# Patient Record
Sex: Female | Born: 1990 | Race: White | Hispanic: No | Marital: Married | Smoking: Former smoker
Health system: Southern US, Community
[De-identification: ages and names within clinical notes are randomized; demographics above are authoritative.]

## PROBLEM LIST (undated history)

## (undated) DIAGNOSIS — O24419 Gestational diabetes mellitus in pregnancy, unspecified control: Secondary | ICD-10-CM

## (undated) DIAGNOSIS — T8859XA Other complications of anesthesia, initial encounter: Secondary | ICD-10-CM

## (undated) DIAGNOSIS — Z8632 Personal history of gestational diabetes: Secondary | ICD-10-CM

## (undated) DIAGNOSIS — E119 Type 2 diabetes mellitus without complications: Secondary | ICD-10-CM

## (undated) DIAGNOSIS — G971 Other reaction to spinal and lumbar puncture: Secondary | ICD-10-CM

## (undated) DIAGNOSIS — Z8751 Personal history of pre-term labor: Secondary | ICD-10-CM

## (undated) HISTORY — PX: THERAPEUTIC ABORTION: SHX798

## (undated) HISTORY — DX: Personal history of gestational diabetes: Z86.32

## (undated) HISTORY — DX: Personal history of pre-term labor: Z87.51

## (undated) HISTORY — PX: CHOLECYSTECTOMY: SHX55

---

## 2015-12-05 DIAGNOSIS — 419620001 Death: Secondary | SNOMED CT

## 2015-12-05 DEATH — deceased

## 2016-03-06 HISTORY — PX: DILATION AND CURETTAGE OF UTERUS: SHX78

## 2018-03-19 ENCOUNTER — Encounter (HOSPITAL_COMMUNITY): Payer: Self-pay | Admitting: Anesthesiology

## 2018-03-19 ENCOUNTER — Encounter (HOSPITAL_COMMUNITY): Payer: Self-pay | Admitting: Emergency Medicine

## 2018-03-19 ENCOUNTER — Emergency Department (HOSPITAL_COMMUNITY): Payer: Medicaid - Out of State

## 2018-03-19 ENCOUNTER — Inpatient Hospital Stay (HOSPITAL_COMMUNITY)
Admission: EM | Admit: 2018-03-19 | Discharge: 2018-03-21 | DRG: 776 | Disposition: A | Discharge status: Home / Self Care | Payer: Medicaid - Out of State | Attending: Obstetrics & Gynecology | Admitting: Obstetrics & Gynecology

## 2018-03-19 ENCOUNTER — Other Ambulatory Visit: Payer: Self-pay

## 2018-03-19 DIAGNOSIS — O894 Spinal and epidural anesthesia-induced headache during the puerperium: Secondary | ICD-10-CM | POA: Diagnosis present

## 2018-03-19 DIAGNOSIS — O1495 Unspecified pre-eclampsia, complicating the puerperium: Secondary | ICD-10-CM | POA: Diagnosis present

## 2018-03-19 DIAGNOSIS — G971 Other reaction to spinal and lumbar puncture: Secondary | ICD-10-CM | POA: Diagnosis present

## 2018-03-19 DIAGNOSIS — O165 Unspecified maternal hypertension, complicating the puerperium: Secondary | ICD-10-CM | POA: Diagnosis present

## 2018-03-19 DIAGNOSIS — Z87891 Personal history of nicotine dependence: Secondary | ICD-10-CM

## 2018-03-19 LAB — PROTIME-INR
INR: 1.02
Prothrombin Time: 13.3 seconds (ref 11.4–15.2)

## 2018-03-19 LAB — COMPREHENSIVE METABOLIC PANEL
ALT: 18 U/L (ref 0–44)
AST: 16 U/L (ref 15–41)
Albumin: 3.4 g/dL — ABNORMAL LOW (ref 3.5–5.0)
Alkaline Phosphatase: 119 U/L (ref 38–126)
Anion gap: 10 (ref 5–15)
BUN: 9 mg/dL (ref 6–20)
CO2: 24 mmol/L (ref 22–32)
Calcium: 9.2 mg/dL (ref 8.9–10.3)
Chloride: 106 mmol/L (ref 98–111)
Creatinine, Ser: 0.7 mg/dL (ref 0.44–1.00)
GFR calc Af Amer: >60 mL/min (ref >60)
GFR calc non Af Amer: >60 mL/min (ref >60)
Glucose, Bld: 94 mg/dL (ref 70–99)
Potassium: 4.2 mmol/L (ref 3.5–5.1)
Sodium: 140 mmol/L (ref 135–145)
Total Bilirubin: 0.8 mg/dL (ref 0.3–1.2)
Total Protein: 7.3 g/dL (ref 6.5–8.1)

## 2018-03-19 LAB — CBC WITH DIFFERENTIAL/PLATELET
Abs Immature Granulocytes: 0.04 10*3/uL (ref 0.00–0.07)
Basophils Absolute: 0 10*3/uL (ref 0.0–0.1)
Basophils Relative: 1 %
Eosinophils Absolute: 0.1 10*3/uL (ref 0.0–0.5)
Eosinophils Relative: 2 %
HCT: 37.7 % (ref 36.0–46.0)
Hemoglobin: 12.1 g/dL (ref 12.0–15.0)
Immature Granulocytes: 1 %
Lymphocytes Relative: 23 %
Lymphs Abs: 1.5 10*3/uL (ref 0.7–4.0)
MCH: 31.9 pg (ref 26.0–34.0)
MCHC: 32.1 g/dL (ref 30.0–36.0)
MCV: 99.5 fL (ref 80.0–100.0)
Monocytes Absolute: 0.3 10*3/uL (ref 0.1–1.0)
Monocytes Relative: 5 %
Neutro Abs: 4.5 10*3/uL (ref 1.7–7.7)
Neutrophils Relative %: 68 %
Platelets: 330 10*3/uL (ref 150–400)
RBC: 3.79 MIL/uL — ABNORMAL LOW (ref 3.87–5.11)
RDW: 12.4 % (ref 11.5–15.5)
WBC: 6.5 10*3/uL (ref 4.0–10.5)
nRBC: 0 % (ref 0.0–0.2)

## 2018-03-19 LAB — URINALYSIS, ROUTINE W REFLEX MICROSCOPIC
Bilirubin Urine: NEGATIVE
Glucose, UA: NEGATIVE mg/dL
Ketones, ur: NEGATIVE mg/dL
Leukocytes, UA: NEGATIVE
Nitrite: NEGATIVE
Protein, ur: NEGATIVE mg/dL
Specific Gravity, Urine: 1.011 (ref 1.005–1.030)
pH: 7 (ref 5.0–8.0)

## 2018-03-19 MED ORDER — MAGNESIUM SULFATE BOLUS VIA INFUSION
4.0000 g | Freq: Once | INTRAVENOUS | Status: AC
  Administered 2018-03-19: 4 g via INTRAVENOUS
  Filled 2018-03-19: qty 500

## 2018-03-19 MED ORDER — LABETALOL HCL 5 MG/ML IV SOLN: 20.0000 mg | INTRAVENOUS | Status: DC | PRN

## 2018-03-19 MED ORDER — LABETALOL HCL 5 MG/ML IV SOLN: 80.0000 mg | INTRAVENOUS | Status: DC | PRN

## 2018-03-19 MED ORDER — HYDRALAZINE HCL 20 MG/ML IJ SOLN: 10.0000 mg | INTRAMUSCULAR | Status: DC | PRN

## 2018-03-19 MED ORDER — ONDANSETRON HCL 4 MG/2ML IJ SOLN
4.0000 mg | Freq: Once | INTRAMUSCULAR | Status: AC
  Administered 2018-03-19: 4 mg via INTRAVENOUS
  Filled 2018-03-19: qty 2

## 2018-03-19 MED ORDER — OXYCODONE-ACETAMINOPHEN 5-325 MG PO TABS
1.0000 | ORAL_TABLET | Freq: Four times a day (QID) | ORAL | Status: DC | PRN
  Administered 2018-03-19 – 2018-03-20 (×4): 2 via ORAL
  Filled 2018-03-19 (×5): qty 2

## 2018-03-19 MED ORDER — HYDRALAZINE HCL 20 MG/ML IJ SOLN
10.0000 mg | Freq: Once | INTRAMUSCULAR | Status: AC
  Administered 2018-03-19: 10 mg via INTRAVENOUS
  Filled 2018-03-19: qty 1

## 2018-03-19 MED ORDER — MAGNESIUM SULFATE 40 G IN LACTATED RINGERS - SIMPLE
2.0000 g/h | INTRAVENOUS | Status: AC
  Administered 2018-03-19: 2 g/h via INTRAVENOUS
  Filled 2018-03-19 (×3): qty 500

## 2018-03-19 MED ORDER — LACTATED RINGERS IV SOLN
INTRAVENOUS | Status: DC
  Administered 2018-03-19: 19:00:00 via INTRAVENOUS

## 2018-03-19 MED ORDER — DIPHENHYDRAMINE HCL 50 MG/ML IJ SOLN
25.0000 mg | Freq: Once | INTRAMUSCULAR | Status: AC
  Administered 2018-03-19: 25 mg via INTRAVENOUS
  Filled 2018-03-19: qty 1

## 2018-03-19 MED ORDER — MORPHINE SULFATE (PF) 4 MG/ML IV SOLN
4.0000 mg | Freq: Once | INTRAVENOUS | Status: AC
  Administered 2018-03-19: 4 mg via INTRAVENOUS
  Filled 2018-03-19: qty 1

## 2018-03-19 MED ORDER — LABETALOL HCL 5 MG/ML IV SOLN: 40.0000 mg | INTRAVENOUS | Status: DC | PRN

## 2018-03-19 MED ORDER — SODIUM CHLORIDE 0.9 % IV BOLUS
1000.0000 mL | Freq: Once | INTRAVENOUS | Status: AC
  Administered 2018-03-19: 1000 mL via INTRAVENOUS

## 2018-03-19 MED ORDER — METOCLOPRAMIDE HCL 5 MG/ML IJ SOLN
10.0000 mg | Freq: Once | INTRAMUSCULAR | Status: AC
  Administered 2018-03-19: 10 mg via INTRAVENOUS
  Filled 2018-03-19: qty 2

## 2018-03-19 NOTE — ED Notes (Signed)
Pt stats abdominal pain has resolved.Report to care link

## 2018-03-19 NOTE — ED Triage Notes (Signed)
Pt brought down by rapid response (visiting her child inpatient) for facial swelling and neck pain. She is 8 days postpartum She was told that she is having muscle spasms from the epidural and complications from it. Denies fever, nausea or vomiting.

## 2018-03-19 NOTE — ED Notes (Addendum)
RE[ort to charge nurse at [facility] room 319.

## 2018-03-19 NOTE — ED Notes (Addendum)
Pt c/o pain at right upper quadrant. Dr. Judd Lien.aware and will see pt.

## 2018-03-19 NOTE — Progress Notes (Signed)
ANMD notified of Dr. Denyce Robert request to see patient.

## 2018-03-19 NOTE — ED Provider Notes (Signed)
[facility] EMERGENCY DEPARTMENT Provider Note   CSN: [id number] Arrival date & time: 03/19/18  1158     History   Chief Complaint Chief Complaint  Patient presents with  . Neck Pain  . Facial Swelling    HPI Yvette Mosley is a 28 y.o. female is 1 week postpartum who presents for evaluation of headache, facial swelling, neck pain.  Patient reports that she has been having an intermittent headache over the last week after she got an epidural for her vaginal delivery.  Patient reports she was seen again by the anesthesiologist and was cleared.  She was given naproxen, Robaxin she states she has been taking but has not provided any relief.  Patient reports that her headache worsened this morning.  She describes it as a pressure in her head.  Patient states that she was upstairs in the NICU visiting her newborn child when she felt like the headache was worsening as well as having some neck pain.  She was noted to have some mild facial swelling and was sent to the ED for further evaluation.  Patient states that the headache is worse if she lays down flat and worse if she sits up and moves around.  Additionally, her neck pain is worse with movement.  She states she has not noted any swelling in her neck or face.  She reports she is still able to tolerate her secretions and p.o. without any difficulty.  She reports no complications during her pregnancy.  Patient reports that she has not had any preceding trauma, tear, fall.  Patient denies any chest pain, vision changes, difficulty breathing, abdominal pain, numbness/weakness of her arms or legs, swelling in her legs.  The history is provided by the patient.    History reviewed. No pertinent past medical history.  Patient Active Problem List   Diagnosis Date Noted  . Preeclampsia in postpartum period 03/19/2018    History reviewed. No pertinent surgical history.   OB History   No obstetric history on file.       Home Medications    Prior to Admission medications   Medication Sig Start Date End Date Taking? Authorizing Provider  methocarbamol (ROBAXIN) 750 MG tablet Take 1,500 mg by mouth 4 (four) times daily.    Yes [provider]  naproxen (NAPRELAN) 500 MG 24 hr tablet Take 500 mg by mouth 2 (two) times daily as needed (pain, swelling).   Yes [provider]    Family History No family history on file.  Social History Social History   Tobacco Use  . Smoking status: Former Smoker    Types: Cigarettes  . Smokeless tobacco: Never Used  Substance Use Topics  . Alcohol use: Not Currently  . Drug use: Not Currently     Allergies   Alpha-gal and Ceclor [cefaclor]   Review of Systems Review of Systems  Constitutional: Negative for fever.  Eyes: Negative for visual disturbance.  Respiratory: Negative for cough and shortness of breath.   Cardiovascular: Negative for chest pain.  Gastrointestinal: Negative for abdominal pain, nausea and vomiting.  Genitourinary: Negative for dysuria and hematuria.  Musculoskeletal: Positive for neck pain. Negative for back pain.  Neurological: Positive for headaches. Negative for weakness and numbness.  All other systems reviewed and are negative.    Physical Exam Updated Vital Signs BP (!) 163/87   Pulse 78   Temp 97.9 F (36.6 C) (Oral)   Resp 16   Ht 5\' 3"  (1.6 m)  Wt 68 kg   SpO2 98%   BMI 26.57 kg/m   Physical Exam Vitals signs and nursing note reviewed.  Constitutional:      Appearance: Normal appearance. She is well-developed.  HENT:     Head: Normocephalic and atraumatic.     Mouth/Throat:     Mouth: Mucous membranes are moist.     Pharynx: Oropharynx is clear.     Comments: Uvula is midline.  No trismus. Airway is patent, phonation is intact. Eyes:     General: Lids are normal.     Extraocular Movements: Extraocular movements intact.     Conjunctiva/sclera: Conjunctivae normal.     Pupils:  Pupils are equal, round, and reactive to light.     Comments: PERRL, EOMs intact  Neck:     Musculoskeletal: Neck supple. No neck rigidity.     Comments: Neck is symmetric in appearance without any edema, erythema.  No crepitus noted. Supple without rigidity. Limited range of motion secondary to pain. Cardiovascular:     Rate and Rhythm: Normal rate and regular rhythm.     Pulses: Normal pulses.          Radial pulses are 2+ on the right side and 2+ on the left side.       Dorsalis pedis pulses are 2+ on the right side and 2+ on the left side.     Heart sounds: Normal heart sounds. No murmur. No friction rub. No gallop.   Pulmonary:     Effort: Pulmonary effort is normal.     Breath sounds: Normal breath sounds.     Comments: Lungs clear to auscultation bilaterally.  Symmetric chest rise.  No wheezing, rales, rhonchi. Abdominal:     Palpations: Abdomen is soft. Abdomen is not rigid.     Tenderness: There is no abdominal tenderness. There is no guarding.     Comments: Abdomen is soft, non-distended, non-tender. No rigidity, No guarding. No peritoneal signs.  Musculoskeletal: Normal range of motion.     Thoracic back: She exhibits no tenderness.     Lumbar back: She exhibits no tenderness.  Skin:    General: Skin is warm and dry.     Capillary Refill: Capillary refill takes less than 2 seconds.  Neurological:     Mental Status: She is alert and oriented to person, place, and time.     Comments: Cranial nerves III-XII intact Follows commands, Moves all extremities  5/5 strength to BUE and BLE  Sensation intact throughout all major nerve distributions Normal finger to nose. No dysdiadochokinesia. No pronator drift. No gait abnormalities  No slurred speech. No facial droop.   Psychiatric:        Speech: Speech normal.      ED Treatments / Results  Labs (all labs ordered are listed, but only abnormal results are displayed) Labs Reviewed  CBC WITH DIFFERENTIAL/PLATELET -  Abnormal; Notable for the following components:      Result Value   RBC 3.79 (*)    All other components within normal limits  COMPREHENSIVE METABOLIC PANEL - Abnormal; Notable for the following components:   Albumin 3.4 (*)    All other components within normal limits  URINALYSIS, ROUTINE W REFLEX MICROSCOPIC - Abnormal; Notable for the following components:   Color, Urine STRAW (*)    Hgb urine dipstick MODERATE (*)    Bacteria, UA RARE (*)    All other components within normal limits  PROTIME-INR    EKG None  Radiology Ct Head  Wo Contrast  Result Date: 03/19/2018 CLINICAL DATA:  Headache with facial swelling and neck pain. Recent postpartum. EXAM: CT HEAD WITHOUT CONTRAST TECHNIQUE: Contiguous axial images were obtained from the base of the skull through the vertex without intravenous contrast. COMPARISON:  None. FINDINGS: Brain: Ventricles are normal in size and configuration. There is no demonstrable intracranial mass, hemorrhage, extra-axial fluid collection, or midline shift. Brain parenchyma appears unremarkable. No acute infarct. An area of decreased attenuation on the axial images along the superior right cerebellum is shown on coronal and sagittal images to represent mild asymmetric fluid adjacent to the tentorium. Vascular: No appreciable hyperdense vessel. No appreciable vascular calcification. Skull: The bony calvarium appears intact. Sinuses/Orbits: Visualized paranasal sinuses are clear. Orbits appear symmetric bilaterally. Other: Mastoid air cells are clear. IMPRESSION: Study within normal limits. Electronically Signed   By: Bretta Bang III M.D.   On: 03/19/2018 13:58    Procedures .Critical Care Performed by: Maxwell Caul, PA-C Authorized by: Maxwell Caul, PA-C   Critical care provider statement:    Critical care time (minutes):  30   Critical care was necessary to treat or prevent imminent or life-threatening deterioration of the following conditions:   Metabolic crisis and CNS failure or compromise   Critical care was time spent personally by me on the following activities:  Discussions with consultants, evaluation of patient's response to treatment, examination of patient, ordering and performing treatments and interventions, ordering and review of laboratory studies, ordering and review of radiographic studies, pulse oximetry, re-evaluation of patient's condition, obtaining history from patient or surrogate and review of old charts   (including critical care time)  Medications Ordered in ED Medications  magnesium bolus via infusion 4 g (has no administration in time range)  magnesium sulfate 40 grams in LR 500 mL OB infusion (has no administration in time range)  sodium chloride 0.9 % bolus 1,000 mL (0 mLs Intravenous Stopped 03/19/18 1605)  metoCLOPramide (REGLAN) injection 10 mg (10 mg Intravenous Given 03/19/18 1401)  diphenhydrAMINE (BENADRYL) injection 25 mg (25 mg Intravenous Given 03/19/18 1409)  ondansetron (ZOFRAN) injection 4 mg (4 mg Intravenous Given 03/19/18 1400)  hydrALAZINE (APRESOLINE) injection 10 mg (10 mg Intravenous Given 03/19/18 1519)     Initial Impression / Assessment and Plan / ED Course  I have reviewed the triage vital signs and the nursing notes.  Pertinent labs & imaging results that were available during my care of the patient were reviewed by me and considered in my medical decision making (see chart for details).     28 year old female who is 1 week postpartum who presents for evaluation of headache, neck pain that is been ongoing for last week.  She reports symptoms worsened today.  She was upstairs visiting her child when symptoms worsen and she was sent down to the ED for further evaluation.  Denies any chest pain, difficulty breathing, vision changes.  Reports normal spontaneous vaginal delivery but does report that she delivered at 37 weeks.  She states that she did not have any complications with the  pregnancy.  On initial ED arrival, patient is slightly bradycardic and hypertensive with blood pressure 169/97.  On my evaluation, her blood pressure was high as 172/111.  Headache is worsened with positional changes.  She states that it is better if she lays down supine and worse if she gets up and turns and moves around.  Consider spinal headache versus preeclampsia. Exam not concerning for peritonsillar abscess or Ludwig's angina.  We will  plan to check basic labs.  Additionally, given hypertension, will give dose of hydralazine.  Patient reports that she saw [place] in [place].  She lived in [place] and then her child was [facility].  Will consult on-call OB/GYN for evaluation of postpartum preeclampsia.  PT/INR is within normal limits.  CMP shows normal LFT's.  CBC without any significant leukocytosis or anemia.  Patiently persistently hypertensive with blood pressures ranging in the 160s over 90s.  Patient had one blood pressure of 139/73 prior to getting hydralazine.  Her repeat blood pressure was again elevated at 163/87.  Hydralazine given.  Discussed patient with Dr. Adrian BlackwaterStinson (OB/GYN).  Given blood pressures as well as patient symptoms, recommends admission to women's for postpartum preeclampsia.  Plan to start magnesium.  Updated patient on plan.  She is agreeable.  Portions of this note were generated with Scientist, clinical (histocompatibility and immunogenetics)Dragon dictation software. Dictation errors may occur despite best attempts at proofreading.   Final Clinical Impressions(s) / ED Diagnoses   Final diagnoses:  Preeclampsia in postpartum period    ED Discharge Orders    None       Maxwell CaulLayden, Jessamyn Watterson A, PA-C 03/20/18 1401    Gwyneth SproutPlunkett, Whitney, MD 03/23/18 1335

## 2018-03-19 NOTE — ED Notes (Signed)
Pt oob to bathroom with steady gait. 

## 2018-03-19 NOTE — H&P (Signed)
[facility] History and Physical  [name] Nicole Arnall FAO:[id number] DOB: 1991/02/22 DOA: 03/19/2018  Referring physician: Ferdinand CavaLayden, PA-C, ED provider PCP: Patient, No Pcp Per  Outpatient Specialists:   Patient Coming From: home  Chief Complaint: headache  HPI: Yvette Mosley is a 28 y.o. female who is 8 days postpartum from a vaginal delivery at 37 weeks due to oligohydramnios. Delivery was uncomplicated. She did have an epidural  For pain control during the labor, which never really worked right per the patient. Following delivery, she had a headache that was worse with sitting up and moving her neck, relieved with laying down. Anesthesia assessed her, told her it wasn't a spinal headache and instructed her to drink lots of caffeine and lay down a lot. Following her release from the hospital, she visited the ED a couple of times with similar complaints, but has just been prescribed muscle relaxerants and NSAIDs, neither of which has helped.   Her baby's course has been complicated by failure to thrive and is currently hospitalized [facility] pediatric [facility]. She presented to the ED for worsening headache and facial swelling.   Emergency Department Course: BP initially 172/111 and has continued to have several BPs in severe range. I was consulted, instructed the start of magnesium, which has not changed the patient's headache.  Review of Systems:   Pt denies any fevers, chills, nausea, vomiting, diarrhea, constipation, abdominal pain, shortness of breath, dyspnea on exertion, orthopnea, cough, wheezing, palpitations, headache, vision changes, lightheadedness, dizziness, melena, rectal bleeding.  Review of systems are otherwise negative  History reviewed. No pertinent past medical history. History reviewed. No pertinent surgical history. Social History:  reports that she has quit smoking. Her smoking use included cigarettes. She has never used smokeless  tobacco. She reports previous alcohol use. She reports previous drug use. Patient lives at home  Allergies  Allergen Reactions  . Alpha-Gal Other (See Comments)    Abdominal pain   . Ceclor [Cefaclor] Hives    No family history on file.    Prior to Admission medications   Medication Sig Start Date End Date Taking? Authorizing Provider  methocarbamol (ROBAXIN) 750 MG tablet Take 1,500 mg by mouth 4 (four) times daily.    Yes [provider]  naproxen (NAPRELAN) 500 MG 24 hr tablet Take 500 mg by mouth 2 (two) times daily as needed (pain, swelling).   Yes [provider]    Physical Exam: BP (!) 136/101 (BP Location: Left Arm) Comment: nurse notified  Pulse 81   Temp 98 F (36.7 C) (Oral)   Resp 20   Ht 5\' 3"  (1.6 m)   Wt 68 kg   SpO2 99%   BMI 26.57 kg/m   . General: Young caucasian female. Awake and alert and oriented x3. No acute cardiopulmonary distress.  Marland Kitchen. HEENT: Normocephalic atraumatic.  Right and left ears normal in appearance.  Pupils equal, round, reactive to light. Extraocular muscles are intact. Sclerae anicteric and noninjected.  Moist mucosal membranes. No mucosal lesions.  . Neck: Neck supple without lymphadenopathy. No carotid bruits. No masses palpated.  . Cardiovascular: Regular rate with normal S1-S2 sounds. No murmurs, rubs, gallops auscultated. No JVD.  Marland Kitchen. Respiratory: Good respiratory effort with no wheezes, rales, rhonchi. Lungs clear to auscultation bilaterally.  No accessory muscle use. . Abdomen: Soft, nontender, nondistended. Active bowel sounds. No masses or hepatosplenomegaly  . Skin: No rashes, lesions, or ulcerations.  Dry, warm to touch. 2+ dorsalis pedis and radial  pulses. . Musculoskeletal: No calf or leg pain. All major joints not erythematous nontender.  No upper or lower joint deformation.  Good ROM.  No contractures  . Psychiatric: Intact judgment and insight. Pleasant and cooperative. . Neurologic: Unwilling to flex or  extend neck. No focal neurological deficits. Strength is 5/5 and symmetric in upper and lower extremities.  Cranial nerves II through XII are grossly intact.           Labs on Admission: I have personally reviewed following labs and imaging studies  CBC: Recent Labs  Lab 03/19/18 1407  WBC 6.5  NEUTROABS 4.5  HGB 12.1  HCT 37.7  MCV 99.5  PLT 330   Basic Metabolic Panel: Recent Labs  Lab 03/19/18 1407  NA 140  K 4.2  CL 106  CO2 24  GLUCOSE 94  BUN 9  CREATININE 0.70  CALCIUM 9.2   GFR: Estimated Creatinine Clearance: 98.6 mL/min (by C-G formula based on SCr of 0.7 mg/dL). Liver Function Tests: Recent Labs  Lab 03/19/18 1407  AST 16  ALT 18  ALKPHOS 119  BILITOT 0.8  PROT 7.3  ALBUMIN 3.4*   No results for input(s): LIPASE, AMYLASE in the last 168 hours. No results for input(s): AMMONIA in the last 168 hours. Coagulation Profile: Recent Labs  Lab 03/19/18 1407  INR 1.02   Cardiac Enzymes: No results for input(s): CKTOTAL, CKMB, CKMBINDEX, TROPONINI in the last 168 hours. BNP (last 3 results) No results for input(s): PROBNP in the last 8760 hours. HbA1C: No results for input(s): HGBA1C in the last 72 hours. CBG: No results for input(s): GLUCAP in the last 168 hours. Lipid Profile: No results for input(s): CHOL, HDL, LDLCALC, TRIG, CHOLHDL, LDLDIRECT in the last 72 hours. Thyroid Function Tests: No results for input(s): TSH, T4TOTAL, FREET4, T3FREE, THYROIDAB in the last 72 hours. Anemia Panel: No results for input(s): VITAMINB12, FOLATE, FERRITIN, TIBC, IRON, RETICCTPCT in the last 72 hours. Urine analysis:    Component Value Date/Time   COLORURINE STRAW (A) 03/19/2018 1500   APPEARANCEUR CLEAR 03/19/2018 1500   LABSPEC 1.011 03/19/2018 1500   PHURINE 7.0 03/19/2018 1500   GLUCOSEU NEGATIVE 03/19/2018 1500   HGBUR MODERATE (A) 03/19/2018 1500   BILIRUBINUR NEGATIVE 03/19/2018 1500   KETONESUR NEGATIVE 03/19/2018 1500   PROTEINUR NEGATIVE  03/19/2018 1500   NITRITE NEGATIVE 03/19/2018 1500   LEUKOCYTESUR NEGATIVE 03/19/2018 1500   Sepsis Labs: @LABRCNTIP (procalcitonin:4,lacticidven:4) )No results found for this or any previous visit (from the past 240 hour(s)).   Radiological Exams on Admission: Ct Head Wo Contrast  Result Date: 03/19/2018 CLINICAL DATA:  Headache with facial swelling and neck pain. Recent postpartum. EXAM: CT HEAD WITHOUT CONTRAST TECHNIQUE: Contiguous axial images were obtained from the base of the skull through the vertex without intravenous contrast. COMPARISON:  None. FINDINGS: Brain: Ventricles are normal in size and configuration. There is no demonstrable intracranial mass, hemorrhage, extra-axial fluid collection, or midline shift. Brain parenchyma appears unremarkable. No acute infarct. An area of decreased attenuation on the axial images along the superior right cerebellum is shown on coronal and sagittal images to represent mild asymmetric fluid adjacent to the tentorium. Vascular: No appreciable hyperdense vessel. No appreciable vascular calcification. Skull: The bony calvarium appears intact. Sinuses/Orbits: Visualized paranasal sinuses are clear. Orbits appear symmetric bilaterally. Other: Mastoid air cells are clear. IMPRESSION: Study within normal limits. Electronically Signed   By: Bretta Bang III M.D.   On: 03/19/2018 13:58    Assessment/Plan: Principal Problem:  Preeclampsia in postpartum period Active Problems:   Headache    This patient was discussed with the ED physician, including pertinent vitals, physical exam findings, labs, and imaging.  We also discussed care given by the ED provider.  1. Preeclampsia in postpartum period a. Start magnesium b. BP control c. Repeat labs tomorrow morning 2. Headache a. ? Spinal headache vs preeclampsia - headache appears atypical of preeclampsia and more similar to a spinal headache. b. Consulted with anesthesia, who will see the  patient.  DVT prophylaxis: SCDs Consultants: anesthesia Code Status: full Family Communication: none    Levie HeritageStinson, Carole Deere J, DO

## 2018-03-20 ENCOUNTER — Inpatient Hospital Stay (HOSPITAL_COMMUNITY): Payer: Medicaid - Out of State | Admitting: Anesthesiology

## 2018-03-20 ENCOUNTER — Encounter (HOSPITAL_COMMUNITY): Payer: Self-pay | Admitting: *Deleted

## 2018-03-20 DIAGNOSIS — O1495 Unspecified pre-eclampsia, complicating the puerperium: Principal | ICD-10-CM

## 2018-03-20 DIAGNOSIS — O165 Unspecified maternal hypertension, complicating the puerperium: Secondary | ICD-10-CM | POA: Diagnosis present

## 2018-03-20 LAB — COMPREHENSIVE METABOLIC PANEL
ALT: 15 U/L (ref 0–44)
AST: 16 U/L (ref 15–41)
Albumin: 3.2 g/dL — ABNORMAL LOW (ref 3.5–5.0)
Alkaline Phosphatase: 106 U/L (ref 38–126)
Anion gap: 7 (ref 5–15)
BUN: 8 mg/dL (ref 6–20)
CO2: 25 mmol/L (ref 22–32)
Calcium: 6.9 mg/dL — ABNORMAL LOW (ref 8.9–10.3)
Chloride: 105 mmol/L (ref 98–111)
Creatinine, Ser: 0.67 mg/dL (ref 0.44–1.00)
GFR calc Af Amer: >60 mL/min (ref >60)
GFR calc non Af Amer: >60 mL/min (ref >60)
Glucose, Bld: 97 mg/dL (ref 70–99)
Potassium: 3.8 mmol/L (ref 3.5–5.1)
Sodium: 137 mmol/L (ref 135–145)
Total Bilirubin: 0.5 mg/dL (ref 0.3–1.2)
Total Protein: 6.6 g/dL (ref 6.5–8.1)

## 2018-03-20 LAB — CBC
HCT: 34.2 % — ABNORMAL LOW (ref 36.0–46.0)
Hemoglobin: 11.2 g/dL — ABNORMAL LOW (ref 12.0–15.0)
MCH: 32.4 pg (ref 26.0–34.0)
MCHC: 32.7 g/dL (ref 30.0–36.0)
MCV: 98.8 fL (ref 80.0–100.0)
Platelets: 282 10*3/uL (ref 150–400)
RBC: 3.46 MIL/uL — ABNORMAL LOW (ref 3.87–5.11)
RDW: 12.7 % (ref 11.5–15.5)
WBC: 6 10*3/uL (ref 4.0–10.5)
nRBC: 0 % (ref 0.0–0.2)

## 2018-03-20 MED ORDER — FENTANYL CITRATE (PF) 100 MCG/2ML IJ SOLN
100.0000 ug | Freq: Once | INTRAMUSCULAR | Status: AC | PRN
  Administered 2018-03-20: 100 ug via INTRAVENOUS
  Filled 2018-03-20: qty 2

## 2018-03-20 MED ORDER — MIDAZOLAM HCL 2 MG/2ML IJ SOLN
2.0000 mg | Freq: Once | INTRAMUSCULAR | Status: AC | PRN
  Administered 2018-03-20: 2 mg via INTRAVENOUS
  Filled 2018-03-20: qty 2

## 2018-03-20 NOTE — Progress Notes (Signed)
Called received from [facility] and RN regarding patient complaining of headache.  Patient seen and evaluated in room 319. Patient reports she had an uneventful epidural placed on 03/11/18 in [place], [place] for vaginal delivery. Patient denies being told of an inadvertent dural puncture at that time.   Skin to lumbar area clean dry and intact. No ecchymosis or tenderness along lumbar spine.   Unable to review anesthesia records from outside hospital. Unsuccessful attempt made to contact anesthesia providers in [place], [place] regarding patient encounter.   Patient describes occipital headache and nuchal pain. Her symptoms could be caused by a post dural puncture headache, if dural puncture was made during epidural placement.     Recommend conservative treatment for post dural puncture headache, possible neurology consult, and possible additional imaging. Potential need for epidural blood patch discussed with patient.

## 2018-03-20 NOTE — Anesthesia Postprocedure Evaluation (Signed)
Anesthesia Post Note  Patient: Web designer  Procedure(s) Performed: AN AD HOC BLOOD PATCH     Patient location during evaluation: Women[facility] Unit Anesthesia Type: Epidural Level of consciousness: awake and alert and oriented Pain management: pain level controlled Vital Signs Assessment: post-procedure vital signs reviewed and stable Respiratory status: spontaneous breathing, nonlabored ventilation and respiratory function stable Cardiovascular status: blood pressure returned to baseline and stable Postop Assessment: no headache and no apparent nausea or vomiting Anesthetic complications: no Comments: Relief of her HA    Last Vitals:  Vitals:   03/20/18 1546 03/20/18 1600  BP: (!) 151/89 (!) 161/91  Pulse: (!) 54 (!) 55  Resp:    Temp:    SpO2:      Last Pain:  Vitals:   03/20/18 1620  TempSrc:   PainSc: 5    Pain Goal:                @ANFLOW60MIN (12500)  )Shagun Wordell A.

## 2018-03-20 NOTE — Lactation Note (Signed)
Lactation Consultation Note  Patient Name: Yvette Mosley COBTV'M Date: 03/20/2018 Reason for consult: Follow-up assessment(re-admit)  Visited with mom of a 88 days old baby, she's a post-partum readmit, baby is currently a patient in [facility] due to FTT, mom is a P4. Mom has been having really bad headaches (probably spinal headaches) and has only pumped once so far, she's not really feeling well. Light were completely off when entering the room, mom wished to keep them that way, asked if she felt any pain or discomfort on her breast in order to out-rule engorgement, but mom voiced that this is her 4th baby and the engorgement episode has already passed, her breast are not that full anymore; she declined a breast assessment. Reviewed engorgement prevention and treatment and instructed her to ask for ice as needed if she starts feeling full again and she's not able to empty the breast.  Baby is currently on breastmilk and on Similac formula, mom was transferred to a different room and her pump kit was taken and discarded. RN Delsa Sale to set up a new pumping kit for mom tonight when she's ready to start pumping again. Mom doesn't have any further questions or concerns regarding BF at this point; she's aware of [facility] services and will call PRN.  Maternal Data    Feeding   Interventions Interventions: Breast feeding basics reviewed  Lactation Tools Discussed/Used Pump Review: Setup, frequency, and cleaning;Milk Storage Initiated by:: RN Date initiated:: 03/19/18   Consult Status Consult Status: PRN    [name] S Romeka Scifres 03/20/2018, 6:14 PM

## 2018-03-20 NOTE — Progress Notes (Signed)
Dr Malen Gauze in with patient discussing plan of care with pt. Questions encouraged and answered by pt and husband

## 2018-03-20 NOTE — Anesthesia Preprocedure Evaluation (Addendum)
Anesthesia Evaluation  Patient identified by MRN, date of birth, ID band Patient awake    Reviewed: Allergy & Precautions, Patient's Chart, lab work & pertinent test results  Airway Mallampati: II  TM Distance: >3 FB Neck ROM: Full    Dental no notable dental hx. (+) Teeth Intact   Pulmonary former smoker,    Pulmonary exam normal breath sounds clear to auscultation       Cardiovascular hypertension, Normal cardiovascular exam Rhythm:Regular Rate:Normal  Hx/o Pre eclampsia was on MgSO4 up until 0500 today   Neuro/Psych  Headaches, Patient had pre eclampsia and delivered in [place], [place] on 03/11/2018. She had an epidural for quick labor according to patient and the anesthesia provider had to stick her 3 times to perform her epidural. She developed a severe HA 3 hours after placement. She was also hypertensive and was started on MgSO4 with a presumptive diagnosis of Pre eclampsia. She continued to have a persistent HA with accompanying nuchal stiffness and discomfort. She was seen, evaluated and treated in the ER at [place], [place] a few days after discharge for HA and was discharged after some improvement from being given Dilaudid, Robaxin and another drug. Her HA returned after about 8 hours and has been persistent. It was initially positional and now is constant with some minor improvement with supine position. She has photophobia but no aural changes. She denies nausea or vomiting. She describes her HA as the worst HA she has ever had. HA is occipital in location with radiation to vertex and she has accompanying nuchal pain. negative psych ROS   GI/Hepatic negative GI ROS, Neg liver ROS,   Endo/Other  negative endocrine ROS  Renal/GU negative Renal ROS  negative genitourinary   Musculoskeletal negative musculoskeletal ROS (+)   Abdominal   Peds  Hematology  (+) anemia ,   Anesthesia Other Findings    Reproductive/Obstetrics                             Anesthesia Physical Anesthesia Plan  ASA: III  Anesthesia Plan: Epidural   Post-op Pain Management:    Induction:   PONV Risk Score and Plan:   Airway Management Planned: Natural Airway  Additional Equipment:   Intra-op Plan:   Post-operative Plan:   Informed Consent: I have reviewed the patients History and Physical, chart, labs and discussed the procedure including the risks, benefits and alternatives for the proposed anesthesia with the patient or authorized representative who has indicated his/her understanding and acceptance.       Plan Discussed with: Anesthesiologist  Anesthesia Plan Comments: (She has findings consistent with a post dural puncture HA. I have discussed the treatment options and she wishes to proceed with lumbar epidural blood patch.)       Anesthesia Quick Evaluation

## 2018-03-20 NOTE — Anesthesia Procedure Notes (Signed)
Epidural  Start time: 03/20/2018 2:35 PM End time: 03/20/2018 2:45 PM  Staffing Anesthesiologist: Mal Amabile, MD Performed: anesthesiologist   Preanesthetic Checklist Completed: patient identified, site marked, surgical consent, pre-op evaluation, timeout performed, IV checked, risks and benefits discussed and monitors and equipment checked  Epidural Patient position: sitting Prep: DuraPrep Patient monitoring: blood pressure and continuous pulse ox Approach: midline Location: L4-L5 Injection technique: LOR air  Needle:  Needle type: Tuohy  Needle gauge: 17 G Needle length: 9 cm Needle insertion depth: 5 cm  Additional Notes Monitors placed , timeout performed, and patient sedated. Epidural space ID'd as above. Left AC vein prepped with chloroprep prior to epidural and tourniquet applied to upper arm. 2ml of whole blood withdrawn in sterile fashion from the previously ID'd Left AC vein and was then injected into the epidural space. The epidural needle was then withdrawn and the patient was placed supine. She reported almost instantaneous relief of her HA and mild lower back discomfort. She will be observed for about an hour in MAU and then returned to her room.Reason for block:Epidural Blood Patch

## 2018-03-20 NOTE — Progress Notes (Signed)
Post Partum Day 9 Subjective: headache worse with sitting, standing  Objective: Blood pressure 125/84, pulse 72, temperature 97.7 F (36.5 C), temperature source Oral, resp. rate 20, height 5\' 3"  (1.6 m), weight 68 kg, SpO2 98 %.  Physical Exam:  General: alert and moderate distress Lochia: appropriate Uterine Fundus: firm Incision: none DVT Evaluation: No evidence of DVT seen on physical exam. DTR 1+/4+ Recent Labs    03/19/18 1407 03/20/18 0608  HGB 12.1 11.2*  HCT 37.7 34.2*    Assessment/Plan: BP is not elevated, d/c magnesium  Anesthesiology consult reviewed, will consult neurology for headache which may be spinal following epidural   LOS: 1 day   Scheryl Darter 03/20/2018, 11:34 AM

## 2018-03-21 NOTE — Discharge Instructions (Signed)
Epidural Blood Patch for Spinal Headache, Care After  Refer to this sheet in the next few weeks. These instructions provide you with information about caring for yourself after your procedure. Your health care provider may also give you more specific instructions. Your treatment has been planned according to current medical practices, but problems sometimes occur. Call your health care provider if you have any problems or questions after your procedure.  What can I expect after the procedure?  After the procedure, it is common to have:  Almost-instant relief from your headache, or have slowly easing pain over the next 24 hours.  Mild backaches. These may last for a few days.  A mild feeling of prickly or tingly skin.  Neck pain.  Pain down the arm or leg (radiating pain).    Follow these instructions at home:  For the first 24 hours after the procedure:  Do not drive if you received a medicine to help you relax (sedative).  Rest in bed as much as you can.  For 2-3 weeks, do not lift anything that is heavier than 10 lb (4.5 kg).  When picking up items from a low position, squat rather than bend.  Avoid too much straining. This can cause the patch to move out of position and cause the headache to return.  Drink enough fluids to keep your urine clear or pale yellow.  Take over-the-counter and prescription medicines only as told by your health care provider.  Keep all follow-up visits as told by your health care provider. This is important.  Contact a health care provider if:  You have a fever.  You have back pain.  You have pain that goes down your legs.  Your headache comes back.  You have trouble with your bowels or bladder.  Get help right away if:  You have a very bad headache.  You have nausea or vomiting.  This information is not intended to replace advice given to you by your health care provider. Make sure you discuss any questions you have with your health care provider.  Document Released: 12/11/2012 Document  Revised: 07/29/2015 Document Reviewed: 05/20/2015  Elsevier Interactive Patient Education  2019 [employer]

## 2018-03-21 NOTE — Discharge Summary (Signed)
Physician Discharge Summary  Patient ID: Yvette Mosley MRN: [id number] DOB/AGE: Apr 03, 1990 28 y.o.  Admit date: 03/19/2018 Discharge date: 03/21/2018  Admission Diagnoses:Principal Problem:   Preeclampsia in postpartum period     Discharge Diagnoses:  Principal Problem:   Preeclampsia in postpartum period Active Problems:   Spinal puncture headache   Postpartum hypertension   Discharged Condition: good  Hospital Course: Chief Complaint: headache  HPI: Yvette Mosley is a 28 y.o. female who is 8 days postpartum from a vaginal delivery at 37 weeks due to oligohydramnios. Delivery was uncomplicated. She did have an epidural  For pain control during the labor, which never really worked right per the patient. Following delivery, she had a headache that was worse with sitting up and moving her neck, relieved with laying down. Anesthesia assessed her, told her it wasn't a spinal headache and instructed her to drink lots of caffeine and lay down a lot. Following her release from the hospital, she visited the ED a couple of times with similar complaints, but has just been prescribed muscle relaxerants and NSAIDs, neither of which has helped.   Her baby's course has been complicated by failure to thrive and is [facility]. She presented to the ED for worsening headache and facial swelling.   Emergency Department Course: BP initially 172/111 and has continued to have several BPs in severe range. I was consulted, instructed the start of magnesium, which has not changed the patient's headache.  Review of Systems:   Pt denies any fevers, chills, nausea, vomiting, diarrhea, constipation, abdominal pain, shortness of breath, dyspnea on exertion, orthopnea, cough, wheezing, palpitations, headache, vision changes, lightheadedness, dizziness, melena, rectal bleeding.  Review of systems are otherwise negative  History reviewed. No pertinent  past medical history. History reviewed. No pertinent surgical history. Social History:  reports that she has quit smoking. Her smoking use included cigarettes. She has never used smokeless tobacco. She reports previous alcohol use. She reports previous drug use. Patient lives at home       Allergies  Allergen Reactions  . Alpha-Gal Other (See Comments)    Abdominal pain   . Ceclor [Cefaclor] Hives    No family history on file.           Prior to Admission medications   Medication Sig Start Date End Date Taking? Authorizing Provider  methocarbamol (ROBAXIN) 750 MG tablet Take 1,500 mg by mouth 4 (four) times daily.    Yes [provider]  naproxen (NAPRELAN) 500 MG 24 hr tablet Take 500 mg by mouth 2 (two) times daily as needed (pain, swelling).   Yes [provider]    Physical Exam: BP (!) 136/101 (BP Location: Left Arm) Comment: nurse notified  Pulse 81   Temp 98 F (36.7 C) (Oral)   Resp 20   Ht 5\' 3"  (1.6 m)   Wt 68 kg   SpO2 99%   BMI 26.57 kg/m    General: Young caucasian female. Awake and alert and oriented x3. No acute cardiopulmonary distress.   HEENT: Normocephalic atraumatic.  Right and left ears normal in appearance.  Pupils equal, round, reactive to light. Extraocular muscles are intact. Sclerae anicteric and noninjected.  Moist mucosal membranes. No mucosal lesions.   Neck: Neck supple without lymphadenopathy. No carotid bruits. No masses palpated.   Cardiovascular: Regular rate with normal S1-S2 sounds. No murmurs, rubs, gallops auscultated. No JVD.   Respiratory: Good respiratory effort with no wheezes, rales, rhonchi. Lungs  clear to auscultation bilaterally.  No accessory muscle use.  Abdomen: Soft, nontender, nondistended. Active bowel sounds. No masses or hepatosplenomegaly   Skin: No rashes, lesions, or ulcerations.  Dry, warm to touch. 2+ dorsalis pedis and radial pulses.  Musculoskeletal: No calf or leg pain. All  major joints not erythematous nontender.  No upper or lower joint deformation.  Good ROM.  No contractures   Psychiatric: Intact judgment and insight. Pleasant and cooperative.  Neurologic: Unwilling to flex or extend neck. No focal neurological deficits. Strength is 5/5 and symmetric in upper and lower extremities.  Cranial nerves II through XII are grossly intact.           Labs on Admission: I have personally reviewed following labs and imaging studies  CBC: LastLabs     Recent Labs  Lab 03/19/18 1407  WBC 6.5  NEUTROABS 4.5  HGB 12.1  HCT 37.7  MCV 99.5  PLT 330     Basic Metabolic Panel: LastLabs  Recent Labs  Lab 03/19/18 1407  NA 140  K 4.2  CL 106  CO2 24  GLUCOSE 94  BUN 9  CREATININE 0.70  CALCIUM 9.2     GFR: Estimated Creatinine Clearance: 98.6 mL/min (by C-G formula based on SCr of 0.7 mg/dL). Liver Function Tests: LastLabs     Recent Labs  Lab 03/19/18 1407  AST 16  ALT 18  ALKPHOS 119  BILITOT 0.8  PROT 7.3  ALBUMIN 3.4*     LastLabs  No results for input(s): LIPASE, AMYLASE in the last 168 hours.   LastLabs  No results for input(s): AMMONIA in the last 168 hours.   Coagulation Profile: LastLabs     Recent Labs  Lab 03/19/18 1407  INR 1.02     Cardiac Enzymes: LastLabs  No results for input(s): CKTOTAL, CKMB, CKMBINDEX, TROPONINI in the last 168 hours.   BNP (last 3 results) RecentLabs(withinlast365days)  No results for input(s): PROBNP in the last 8760 hours.   HbA1C: RecentLabs(last2labs)  No results for input(s): HGBA1C in the last 72 hours.   CBG: LastLabs  No results for input(s): GLUCAP in the last 168 hours.   Lipid Profile: RecentLabs(last2labs)  No results for input(s): CHOL, HDL, LDLCALC, TRIG, CHOLHDL, LDLDIRECT in the last 72 hours.   Thyroid Function Tests: RecentLabs(last2labs)  No results for input(s): TSH, T4TOTAL, FREET4, T3FREE, THYROIDAB in the  last 72 hours.   Anemia Panel: RecentLabs(last2labs)  No results for input(s): VITAMINB12, FOLATE, FERRITIN, TIBC, IRON, RETICCTPCT in the last 72 hours.   Urine analysis: Labs(Brief)          Component Value Date/Time   COLORURINE STRAW (A) 03/19/2018 1500   APPEARANCEUR CLEAR 03/19/2018 1500   LABSPEC 1.011 03/19/2018 1500   PHURINE 7.0 03/19/2018 1500   GLUCOSEU NEGATIVE 03/19/2018 1500   HGBUR MODERATE (A) 03/19/2018 1500   BILIRUBINUR NEGATIVE 03/19/2018 1500   KETONESUR NEGATIVE 03/19/2018 1500   PROTEINUR NEGATIVE 03/19/2018 1500   NITRITE NEGATIVE 03/19/2018 1500   LEUKOCYTESUR NEGATIVE 03/19/2018 1500     Sepsis Labs: @LABRCNTIP (procalcitonin:4,lacticidven:4) )No results found for this or any previous visit (from the past 240 hour(s)).   Radiological Exams on Admission:  ImagingResults(Last48hours)  Ct Head Wo Contrast  Result Date: 03/19/2018 CLINICAL DATA:  Headache with facial swelling and neck pain. Recent postpartum. EXAM: CT HEAD WITHOUT CONTRAST TECHNIQUE: Contiguous axial images were obtained from the base of the skull through the vertex without intravenous contrast. COMPARISON:  None. FINDINGS: Brain: Ventricles are normal in  size and configuration. There is no demonstrable intracranial mass, hemorrhage, extra-axial fluid collection, or midline shift. Brain parenchyma appears unremarkable. No acute infarct. An area of decreased attenuation on the axial images along the superior right cerebellum is shown on coronal and sagittal images to represent mild asymmetric fluid adjacent to the tentorium. Vascular: No appreciable hyperdense vessel. No appreciable vascular calcification. Skull: The bony calvarium appears intact. Sinuses/Orbits: Visualized paranasal sinuses are clear. Orbits appear symmetric bilaterally. Other: Mastoid air cells are clear. IMPRESSION: Study within normal limits. Electronically Signed   By: Bretta Bang III M.D.   On:  03/19/2018 13:58     Assessment/Plan: Principal Problem:   Preeclampsia in postpartum period Active Problems:   Headache    This patient was discussed with the ED physician, including pertinent vitals, physical exam findings, labs, and imaging.  We also discussed care given by the ED provider.  1. Preeclampsia in postpartum period a. Start magnesium b. BP control c. Repeat labs tomorrow morning 2. Headache a. ? Spinal headache vs preeclampsia - headache appears atypical of preeclampsia and more similar to a spinal headache. b. Consulted with anesthesia, who will see the patient.  DVT prophylaxis: SCDs Consultants: anesthesia Code Status: full Family Communication: none    Stinson, Rhona Raider, DO   Signed         Show:Clear all [x] Manual[x] Template[] Copied  Added by: [x] Mal Amabile, MD  [] Hover for details Epidural  Start time: 03/20/2018 2:35 PM End time: 03/20/2018 2:45 PM  Staffing Anesthesiologist: Mal Amabile, MD Performed: anesthesiologist   Preanesthetic Checklist Completed: patient identified, site marked, surgical consent, pre-op evaluation, timeout performed, IV checked, risks and benefits discussed and monitors and equipment checked  Epidural Patient position: sitting Prep: DuraPrep Patient monitoring: blood pressure and continuous pulse ox Approach: midline Location: L4-L5 Injection technique: LOR air  Needle:  Needle type: Tuohy  Needle gauge: 17 G Needle length: 9 cm Needle insertion depth: 5 cm  Additional Notes Monitors placed , timeout performed, and patient sedated. Epidural space ID'd as above. Left AC vein prepped with chloroprep prior to epidural and tourniquet applied to upper arm. 20ml of whole blood withdrawn in sterile fashion from the previously ID'd Left AC vein and was then injected into the epidural space. The epidural needle was then withdrawn and the patient was placed supine. She reported almost  instantaneous relief of her HA and mild lower back discomfort. She will be observed for about an hour in MAU and then returned to her room.Reason for block:Epidural Blood Patch            Electronically signed by Mal Amabile, MD at 03/20/2018 4:18 PM She felt much better after blood patch and is well the day of discharge   Consults: Anesthesiology  Significant Diagnostic Studies: labs:  CBC    Component Value Date/Time   WBC 6.0 03/20/2018 0608   RBC 3.46 (L) 03/20/2018 0608   HGB 11.2 (L) 03/20/2018 0608   HCT 34.2 (L) 03/20/2018 0608   PLT 282 03/20/2018 0608   MCV 98.8 03/20/2018 0608   MCH 32.4 03/20/2018 0608   MCHC 32.7 03/20/2018 0608   RDW 12.7 03/20/2018 0608   LYMPHSABS 1.5 03/19/2018 1407   MONOABS 0.3 03/19/2018 1407   EOSABS 0.1 03/19/2018 1407   BASOSABS 0.0 03/19/2018 1407   CMP Latest Ref Rng & Units 03/20/2018 03/19/2018  Glucose 70 - 99 mg/dL 97 94  BUN 6 - 20 mg/dL 8 9  Creatinine 1.61 - 1.00 mg/dL 0.96 0.45  Sodium 135 - 145 mmol/L 137 140  Potassium 3.5 - 5.1 mmol/L 3.8 4.2  Chloride 98 - 111 mmol/L 105 106  CO2 22 - 32 mmol/L 25 24  Calcium 8.9 - 10.3 mg/dL 6.9(L) 9.2  Total Protein 6.5 - 8.1 g/dL 6.6 7.3  Total Bilirubin 0.3 - 1.2 mg/dL 0.5 0.8  Alkaline Phos 38 - 126 U/L 106 119  AST 15 - 41 U/L 16 16  ALT 0 - 44 U/L 15 18     Treatments: Epidural blood patch  Discharge Exam: Blood pressure 126/68, pulse 62, temperature 98 F (36.7 C), temperature source Oral, resp. rate 18, height 5\' 3"  (1.6 m), weight 68 kg, SpO2 99 %. General appearance: alert, cooperative and no distress Extremities: extremities normal, atraumatic, no cyanosis or edema  Disposition: Discharge disposition: 01-Home or Self Care       Discharge Instructions    Discharge patient   Complete by:  As directed    Discharge disposition:  01-Home or Self Care   Discharge patient date:  03/21/2018     Allergies as of 03/21/2018      Reactions   Alpha-gal Other  (See Comments)   Abdominal pain    Ceclor [cefaclor] Hives      Medication List    TAKE these medications   methocarbamol 750 MG tablet Commonly known as:  ROBAXIN Take 1,500 mg by mouth 4 (four) times daily.   naproxen 500 MG 24 hr tablet Commonly known as:  NAPRELAN Take 500 mg by mouth 2 (two) times daily as needed (pain, swelling).      Follow-up Information    [facility]   Contact information: [facility] [place] [id number] [phone]           Signed: Scheryl DarterJames Sovereign Ramiro 03/21/2018, 9:14 AM

## 2019-01-25 DIAGNOSIS — O368390 Maternal care for abnormalities of the fetal heart rate or rhythm, unspecified trimester, not applicable or unspecified: Secondary | ICD-10-CM

## 2020-01-14 ENCOUNTER — Telehealth: Payer: Self-pay | Admitting: *Deleted

## 2020-01-14 NOTE — Telephone Encounter (Signed)
Pt left message stating that she is moving to  from [place] and wants to transfer prenatal care. She is currently 18.3 wks. I called pt back and discussed further. She stated that her last scheduled prenatal visit in [place] is on 11/22. She will be living  @ [address] [place], [place] beginning 11/26. I advised her to sign release of information form for transfer of her records during visit on 11/22. We will schedule appt and notify her. Pt voiced understanding.

## 2020-02-02 ENCOUNTER — Encounter: Payer: Self-pay | Admitting: *Deleted

## 2020-02-09 ENCOUNTER — Encounter: Payer: Medicaid - Out of State | Admitting: Obstetrics and Gynecology

## 2020-02-09 ENCOUNTER — Other Ambulatory Visit: Payer: Self-pay

## 2020-02-09 ENCOUNTER — Encounter: Payer: Self-pay | Admitting: Obstetrics and Gynecology

## 2020-02-09 ENCOUNTER — Ambulatory Visit (INDEPENDENT_AMBULATORY_CARE_PROVIDER_SITE_OTHER): Payer: Medicaid Other | Admitting: Obstetrics and Gynecology

## 2020-02-09 VITALS — BP 107/74 | HR 91 | Wt 142.9 lb

## 2020-02-09 DIAGNOSIS — O0990 Supervision of high risk pregnancy, unspecified, unspecified trimester: Secondary | ICD-10-CM

## 2020-02-09 DIAGNOSIS — F32A Depression, unspecified: Secondary | ICD-10-CM

## 2020-02-09 DIAGNOSIS — O99342 Other mental disorders complicating pregnancy, second trimester: Secondary | ICD-10-CM

## 2020-02-09 DIAGNOSIS — Z8632 Personal history of gestational diabetes: Secondary | ICD-10-CM

## 2020-02-09 DIAGNOSIS — Z3A22 22 weeks gestation of pregnancy: Secondary | ICD-10-CM

## 2020-02-09 DIAGNOSIS — Z8482 Family history of sudden infant death syndrome: Secondary | ICD-10-CM | POA: Insufficient documentation

## 2020-02-09 MED ORDER — BLOOD PRESSURE KIT DEVI: 1.0000 | 0 refills | Status: DC | PRN

## 2020-02-09 NOTE — Progress Notes (Signed)
NEW OB packet given  Declined flu vaccine today will reconsider later   Addison Naegeli, RN

## 2020-02-09 NOTE — Progress Notes (Signed)
  Subjective:    Yvette Mosley is a [id number] [redacted]w[redacted]d being seen today for her first obstetrical visit.  Her obstetrical history is significant for Hx PEC (P4), hx of SIDs at 2 months (2017). Hx GDM (P5).  Patient does intend to breast feed. Pregnancy history fully reviewed.  Patient reports headache that caused her eyes to tear up yesterday. It went away without intervention. No headache today. Otherwise feeling well today.   Recently moved to [facility]. Driving forty minutes to office.  Feels safe at home.  History of depression - Has been effexor in past. Stopped taking during pregnancy. Does not want to start anything right now.    Vitals:   02/09/20 1419  BP: 107/74  Pulse: 91  Weight: 142 lb 14.4 oz (64.8 kg)    HISTORY: OB History  Gravida Para Term Preterm AB Living  7 4 4   2 3   SAB TAB Ectopic Multiple Live Births  2       4    # Outcome Date GA Lbr Len/2nd Weight Sex Delivery Anes PTL Lv  7 Current           6 Term 03/11/18 [redacted]w[redacted]d   M Vag-Spont   LIV  5 Term 11/05/15 [redacted]w[redacted]d   F Vag-Spont   DEC     Complications: SIDS (sudden infant death syndrome)  4 Term Jul 14, 2014 [redacted]w[redacted]d   M Vag-Spont   LIV  3 Term 07/19/10 [redacted]w[redacted]d   F Vag-Spont   LIV  2 SAB           1 SAB            No past medical history on file. Past Surgical History:  Procedure Laterality Date  . CHOLECYSTECTOMY     No family history on file.   Exam    System: Breast:  normal appearance, no masses or tenderness   Skin: normal coloration and turgor, no rashes    Neurologic: normal   Extremities: normal strength, tone, and muscle mass   HEENT PERRLA   Neck supple   Cardiovascular: regular rate and rhythm   Respiratory:  appears well, vitals normal, no respiratory distress, acyanotic, normal RR, ear and throat exam is normal, neck free of mass or lymphadenopathy, chest clear, no wheezing, crepitations, rhonchi, normal symmetric air entry   Abdomen: soft, non-tender; bowel sounds normal; no  masses,  no organomegaly      Assessment:    Pregnancy: [redacted]w[redacted]d Patient Active Problem List   Diagnosis Date Noted  . Supervision of high risk pregnancy, antepartum 02/09/2020  . Postpartum hypertension 03/20/2018  . Preeclampsia in postpartum period 03/19/2018  . Spinal puncture headache 03/19/2018        Plan:     Initial labs reviewed from prior clinic. Early one hour gtt normal. Labs all normal. Declined flu.  Prenatal vitamins. Discussed behavioral health with patient, declines starting new medication, declines BH referral.   Problem list reviewed and updated.  Ultrasound discussed; will order as it appears that she had an incomplete anatomy scan.   Follow up in 4 weeks for PNV, 2 hr gtt.     03/21/2018 02/09/2020

## 2020-02-11 ENCOUNTER — Telehealth: Payer: Self-pay | Admitting: *Deleted

## 2020-02-11 LAB — URINE CULTURE, OB REFLEX

## 2020-02-11 LAB — CULTURE, OB URINE

## 2020-02-11 NOTE — Telephone Encounter (Signed)
Pt left VM message stating that she has seen her test result in MyChart. She wants to know if this is a UTI?

## 2020-02-12 DIAGNOSIS — Z8759 Personal history of other complications of pregnancy, childbirth and the puerperium: Secondary | ICD-10-CM

## 2020-02-12 DIAGNOSIS — O09891 Supervision of other high risk pregnancies, first trimester: Secondary | ICD-10-CM

## 2020-02-12 DIAGNOSIS — O0990 Supervision of high risk pregnancy, unspecified, unspecified trimester: Secondary | ICD-10-CM

## 2020-02-12 NOTE — Telephone Encounter (Signed)
Returned patients call. She did not answer. Will send My Chart message to patient.

## 2020-02-19 DIAGNOSIS — O09891 Supervision of other high risk pregnancies, first trimester: Secondary | ICD-10-CM | POA: Insufficient documentation

## 2020-02-19 DIAGNOSIS — Z8759 Personal history of other complications of pregnancy, childbirth and the puerperium: Secondary | ICD-10-CM | POA: Insufficient documentation

## 2020-03-06 NOTE — L&D Delivery Note (Signed)
OB/GYN Faculty Practice Delivery Note  Yvette Mosley is a 30 y.o. [id number] s/p breech vaginal delivery at [redacted]w[redacted]d. She was admitted for labor management s/p SROM at 1500 on 06/07/2020.   ROM: 12h 20m with clear fluid; thick terminal meconium GBS Status: negative Maximum Maternal Temperature: 98.33F  Labor Progress: Pt with SROM at 1500 on 06/07/2020. On admission, decision to proceed with breech vaginal delivery s/p shared decision making with patient. She was augmented with pitocin and progressed to complete cervical dilation with good fetal tolerance. She then had an uncomplicated delivery as noted below. Given breech presentation, Dr. Shawnie Pons was notified at onset of second stage and was present in the room at time of delivery.  Delivery Date/Time: 06/08/20 at 0342 Delivery: Called to room and patient was complete and pushing. Infant delivered without complication from frank breech presentation with use of breech maneuvers. No nuchal cord present. Given initial Apgar of 3, umbilical cord was clamped x2 and cut by author at 15 seconds s/p delivery. Infant was immediately passed to NICU team at bedside. Arterial cord gas and cord blood were drawn. Placenta delivered spontaneously with gentle cord traction. Fundus firm with massage and Pitocin. Labia, perineum, vagina, and cervix were inspected, without evidence of lacerations.   Placenta: 3-vessel cord, intact, sent to L&D Complications: none Lacerations: none EBL: 457 ml Analgesia: epidural  Infant: viable female  APGARs 3 & 7  weight 3084g  arterial cord gas: pH 7.31  Pt noted to have several large clots while still on L&D. Given somewhat boggy uterus, lower uterine sweep was performed (additional 182 ml of clots retrieved) and I&O cath performed (500 ml UOP). TXA and methergine were also administered with good improvement in uterine tone.  Lynnda Shields, MD OB/GYN Fellow, Faculty Practice

## 2020-03-09 ENCOUNTER — Encounter: Payer: Self-pay | Admitting: Advanced Practice Midwife

## 2020-03-11 ENCOUNTER — Telehealth: Payer: Self-pay | Admitting: General Practice

## 2020-03-11 NOTE — Telephone Encounter (Signed)
   Anairis Melondy Blanchard DOB: 1990/04/13 MRN: [id number]   RIDER WAIVER AND RELEASE OF LIABILITY  For purposes of improving physical access to our facilities, [facility] is pleased to partner with third parties to provide [facility] patients or other authorized individuals the option of convenient, on-demand ground transportation services (the [employer]") through use of the technology service that enables users to request on-demand ground transportation from independent third-party providers.  By opting to use and accept these [employer], I, the undersigned, hereby agree on behalf of myself, and on behalf of any minor child using the [employer] for whom I am the parent or legal guardian, as follows:  1. Science writer provided to me are provided by independent third-party transportation providers who are not [employer] or employees and who are unaffiliated with [employer]. 2. [facility] is neither a transportation carrier nor a common or public carrier. 3. [facility] has no control over the quality or safety of the transportation that occurs as a result of the [employer]. 4. [facility] cannot guarantee that any third-party transportation provider will complete any arranged transportation service. 5. [facility] makes no representation, warranty, or guarantee regarding the reliability, timeliness, quality, safety, suitability, or availability of any of the Transport Services or that they will be error free. 6. I fully understand that traveling by vehicle involves risks and dangers of serious bodily injury, including permanent disability, paralysis, and death. I agree, on behalf of myself and on behalf of any minor child using the Transport Services for whom I am the parent or legal guardian, that the entire risk arising out of my use of the [employer] remains solely with me, to the maximum extent permitted under applicable law. 7. The [employer] are provided "as is" and "as available." [facility] disclaims all representations and warranties, express, implied or statutory, not expressly set out in these terms, including the implied warranties of merchantability and fitness for a particular purpose. 8. I hereby waive and release [facility], its agents, employees, officers, directors, representatives, insurers, attorneys, assigns, successors, subsidiaries, and affiliates from any and all past, present, or future claims, demands, liabilities, actions, causes of action, or suits of any kind directly or indirectly arising from acceptance and use of the [employer]. 9. I further waive and release [facility] and its affiliates from all present and future liability and responsibility for any injury or death to persons or damages to property caused by or related to the use of the [employer]. 10. I have read this Waiver and Release of Liability, and I understand the terms used in it and their legal significance. This Waiver is freely and voluntarily given with the understanding that my right (as well as the right of any minor child for whom I am the parent or legal guardian using the [employer]) to legal recourse against [facility] in connection with the [employer] is knowingly surrendered in return for use of these services.   I attest that I read the consent document to Waver Avon Gully, gave Ms. Haroon the opportunity to ask questions and answered the questions asked (if any). I affirm that Tavaria Avon Gully then provided consent for she's participation in this program.     Launa Grill

## 2020-03-16 ENCOUNTER — Ambulatory Visit: Payer: Medicaid Other | Attending: Obstetrics and Gynecology

## 2020-03-16 ENCOUNTER — Other Ambulatory Visit: Payer: Self-pay | Admitting: Obstetrics and Gynecology

## 2020-03-16 ENCOUNTER — Other Ambulatory Visit: Payer: Medicaid Other

## 2020-03-16 ENCOUNTER — Other Ambulatory Visit: Payer: Self-pay

## 2020-03-16 ENCOUNTER — Ambulatory Visit (INDEPENDENT_AMBULATORY_CARE_PROVIDER_SITE_OTHER): Payer: Medicaid Other | Admitting: Advanced Practice Midwife

## 2020-03-16 VITALS — BP 102/67 | HR 83 | Wt 151.4 lb

## 2020-03-16 DIAGNOSIS — Z8759 Personal history of other complications of pregnancy, childbirth and the puerperium: Secondary | ICD-10-CM

## 2020-03-16 DIAGNOSIS — O365920 Maternal care for other known or suspected poor fetal growth, second trimester, not applicable or unspecified: Secondary | ICD-10-CM

## 2020-03-16 DIAGNOSIS — O321XX0 Maternal care for breech presentation, not applicable or unspecified: Secondary | ICD-10-CM

## 2020-03-16 DIAGNOSIS — Z3A27 27 weeks gestation of pregnancy: Secondary | ICD-10-CM | POA: Diagnosis not present

## 2020-03-16 DIAGNOSIS — Z8632 Personal history of gestational diabetes: Secondary | ICD-10-CM

## 2020-03-16 DIAGNOSIS — Z7189 Other specified counseling: Secondary | ICD-10-CM | POA: Diagnosis not present

## 2020-03-16 DIAGNOSIS — Z363 Encounter for antenatal screening for malformations: Secondary | ICD-10-CM | POA: Diagnosis not present

## 2020-03-16 DIAGNOSIS — O09292 Supervision of pregnancy with other poor reproductive or obstetric history, second trimester: Secondary | ICD-10-CM | POA: Diagnosis not present

## 2020-03-16 DIAGNOSIS — O0990 Supervision of high risk pregnancy, unspecified, unspecified trimester: Secondary | ICD-10-CM

## 2020-03-16 DIAGNOSIS — O365921 Maternal care for other known or suspected poor fetal growth, second trimester, fetus 1: Secondary | ICD-10-CM | POA: Diagnosis present

## 2020-03-16 DIAGNOSIS — N9489 Other specified conditions associated with female genital organs and menstrual cycle: Secondary | ICD-10-CM

## 2020-03-16 DIAGNOSIS — O26892 Other specified pregnancy related conditions, second trimester: Secondary | ICD-10-CM

## 2020-03-16 DIAGNOSIS — Z23 Encounter for immunization: Secondary | ICD-10-CM | POA: Diagnosis not present

## 2020-03-16 DIAGNOSIS — O09891 Supervision of other high risk pregnancies, first trimester: Secondary | ICD-10-CM

## 2020-03-16 LAB — POCT URINALYSIS DIP (DEVICE)
Bilirubin Urine: NEGATIVE
Glucose, UA: NEGATIVE mg/dL
Hgb urine dipstick: NEGATIVE
Ketones, ur: NEGATIVE mg/dL
Leukocytes,Ua: NEGATIVE
Nitrite: NEGATIVE
Protein, ur: NEGATIVE mg/dL
Specific Gravity, Urine: >=1.03 (ref 1.005–1.030)
Urobilinogen, UA: 0.2 mg/dL (ref 0.0–1.0)
pH: 6 (ref 5.0–8.0)

## 2020-03-16 MED ORDER — MISC. DEVICES MISC: 0 refills | Status: DC

## 2020-03-16 MED ORDER — CYCLOBENZAPRINE HCL 10 MG PO TABS: 10.0000 mg | ORAL_TABLET | Freq: Three times a day (TID) | ORAL | 2 refills | Status: DC | PRN

## 2020-03-16 NOTE — Addendum Note (Signed)
Addended by: Marjo Bicker on: 03/16/2020 04:36 PM   Modules accepted: Orders

## 2020-03-16 NOTE — Progress Notes (Signed)
   PRENATAL VISIT NOTE  Subjective:  Yvette Mosley is a 30 y.o. [phone] at [redacted]w[redacted]d being seen today for ongoing prenatal care.  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  Patient reports two week history of recurrent pelvic pressure. States she sometimes wakes up with pain..  Contractions: Not present. Vag. Bleeding: None.  Movement: Present. Denies leaking of fluid.   The following portions of the patient's history were reviewed and updated as appropriate: allergies, current medications, past family history, past medical history, past social history, past surgical history and problem list. Problem list updated.  Objective:   Vitals:   03/16/20 0846  BP: 102/67  Pulse: 83  Weight: 151 lb 6.4 oz (68.7 kg)    Fetal Status: Fetal Heart Rate (bpm): 140 Fundal Height: 27 cm Movement: Present     General:  Alert, oriented and cooperative. Patient is in no acute distress.  Skin: Skin is warm and dry. No rash noted.   Cardiovascular: Normal heart rate noted  Respiratory: Normal respiratory effort, no problems with respiration noted  Abdomen: Soft, gravid, appropriate for gestational age.  Pain/Pressure: Present     Pelvic: Cervical exam performed closed/thick/posterior        Extremities: Normal range of motion.  Edema: None  Mental Status: Normal mood and affect. Normal behavior. Normal judgment and thought content.   Assessment and Plan:  Pregnancy: [id number] at [redacted]w[redacted]d  1. Supervision of high risk  pregnancy, antepartum - Reassurance provided, preterm labor precautions reviewed - Flexeril for general discomfort, help falling asleep - Maternity belt advised  -COVID-19 Vaccine Counseling: The patient was counseled on the potential benefits and lack of known risks of COVID vaccination, during pregnancy and breastfeeding, during today's visit. The patient's questions and concerns were addressed today, including safety of the vaccination and potential side effects as they have been published by ACOG and SMFM. The patient has been informed that there have not been any documented vaccine related injuries, deaths or birth defects to infant or mom after receiving the COVID-19 vaccine to date. The patient has been made aware that although she is not at increased risk of contracting COVID-19 during pregnancy, she is at increased risk of developing severe disease and complications if she contracts COVID-19 while pregnant. All patient questions were addressed during our visit today. The patient is still unsure of her decision for vaccination.    2. History of pre-eclampsia - normotensive  3. Short interval between pregnancies affecting pregnancy in first trimester, antepartum   4. History of gestational diabetes   5. [redacted] weeks gestation of pregnancy   6. History of oligohydramnios   Preterm labor symptoms and general obstetric precautions including but not limited to vaginal bleeding, contractions, leaking of fluid and fetal movement were reviewed in detail with the patient. Please refer to After Visit Summary for other counseling recommendations.  Return in about 2 weeks (around 03/30/2020).  Future Appointments  Date Time Provider Department Center  03/30/2020 10:55 AM [place], Brand Males, NP [facility]    Calvert Cantor, CNM

## 2020-03-16 NOTE — Patient Instructions (Addendum)
Blood pressure cuff [facility] [address] [place], [place] [address] Phone:  [phone]   Fetal Movement Counts Patient Name: ________________________________________________ Patient Due Date: ____________________  What is a fetal movement count? A fetal movement count is the number of times that you feel your baby move during a certain amount of time. This may also be called a fetal kick count. A fetal movement count is recommended for every pregnant woman. You may be asked to start counting fetal movements as early as week 28 of your pregnancy. Pay attention to when your baby is most active. You may notice your baby's sleep and wake cycles. You may also notice things that make your baby move more. You should do a fetal movement count:  When your baby is normally most active.  At the same time each day. A good time to count movements is while you are resting, after having something to eat and drink. How do I count fetal movements? 1. Find a quiet, comfortable area. Sit, or lie down on your side. 2. Write down the date, the start time and stop time, and the number of movements that you felt between those two times. Take this information with you to your health care visits. 3. Write down your start time when you feel the first movement. 4. Count kicks, flutters, swishes, rolls, and jabs. You should feel at least 10 movements. 5. You may stop counting after you have felt 10 movements, or if you have been counting for 2 hours. Write down the stop time. 6. If you do not feel 10 movements in 2 hours, contact your health care provider for further instructions. Your health care provider may want to do additional tests to assess your baby's well-being. Contact a health care provider if:  You feel fewer than 10 movements in 2 hours.  Your baby is not moving like he or she usually does. Date: ____________ Start time: ____________ Stop time: ____________ Movements:  ____________ Date: ____________ Start time: ____________ Stop time: ____________ Movements: ____________ Date: ____________ Start time: ____________ Stop time: ____________ Movements: ____________ Date: ____________ Start time: ____________ Stop time: ____________ Movements: ____________ Date: ____________ Start time: ____________ Stop time: ____________ Movements: ____________ Date: ____________ Start time: ____________ Stop time: ____________ Movements: ____________ Date: ____________ Start time: ____________ Stop time: ____________ Movements: ____________ Date: ____________ Start time: ____________ Stop time: ____________ Movements: ____________ Date: ____________ Start time: ____________ Stop time: ____________ Movements: ____________ This information is not intended to replace advice given to you by your health care provider. Make sure you discuss any questions you have with your health care provider. Document Revised: 10/10/2018 Document Reviewed: 10/10/2018 Elsevier Patient Education  2021 [employer].

## 2020-03-17 ENCOUNTER — Other Ambulatory Visit: Payer: Self-pay | Admitting: *Deleted

## 2020-03-17 DIAGNOSIS — O365920 Maternal care for other known or suspected poor fetal growth, second trimester, not applicable or unspecified: Secondary | ICD-10-CM

## 2020-03-17 LAB — CBC
Hematocrit: 31.4 % — ABNORMAL LOW (ref 34.0–46.6)
Hemoglobin: 10.9 g/dL — ABNORMAL LOW (ref 11.1–15.9)
MCH: 33.5 pg — ABNORMAL HIGH (ref 26.6–33.0)
MCHC: 34.7 g/dL (ref 31.5–35.7)
MCV: 97 fL (ref 79–97)
Platelets: 211 10*3/uL (ref 150–450)
RBC: 3.25 x10E6/uL — ABNORMAL LOW (ref 3.77–5.28)
RDW: 11.8 % (ref 11.7–15.4)
WBC: 9 10*3/uL (ref 3.4–10.8)

## 2020-03-17 LAB — RPR: RPR Ser Ql: NONREACTIVE

## 2020-03-17 LAB — GLUCOSE TOLERANCE, 2 HOURS W/ 1HR
Glucose, 1 hour: 123 mg/dL (ref 65–179)
Glucose, 2 hour: 93 mg/dL (ref 65–152)
Glucose, Fasting: 70 mg/dL (ref 65–91)

## 2020-03-17 LAB — HIV ANTIBODY (ROUTINE TESTING W REFLEX): HIV Screen 4th Generation wRfx: NONREACTIVE

## 2020-03-24 ENCOUNTER — Encounter: Payer: Self-pay | Admitting: *Deleted

## 2020-03-26 ENCOUNTER — Ambulatory Visit: Payer: Medicaid Other | Admitting: *Deleted

## 2020-03-26 ENCOUNTER — Ambulatory Visit: Payer: Medicaid Other | Attending: Obstetrics and Gynecology

## 2020-03-26 ENCOUNTER — Encounter: Payer: Self-pay | Admitting: *Deleted

## 2020-03-26 ENCOUNTER — Other Ambulatory Visit: Payer: Self-pay

## 2020-03-26 DIAGNOSIS — O365930 Maternal care for other known or suspected poor fetal growth, third trimester, not applicable or unspecified: Secondary | ICD-10-CM

## 2020-03-26 DIAGNOSIS — Z8759 Personal history of other complications of pregnancy, childbirth and the puerperium: Secondary | ICD-10-CM

## 2020-03-26 DIAGNOSIS — O09891 Supervision of other high risk pregnancies, first trimester: Secondary | ICD-10-CM | POA: Diagnosis present

## 2020-03-26 DIAGNOSIS — O365990 Maternal care for other known or suspected poor fetal growth, unspecified trimester, not applicable or unspecified: Secondary | ICD-10-CM | POA: Insufficient documentation

## 2020-03-26 DIAGNOSIS — Z3A28 28 weeks gestation of pregnancy: Secondary | ICD-10-CM | POA: Diagnosis not present

## 2020-03-26 DIAGNOSIS — O0990 Supervision of high risk pregnancy, unspecified, unspecified trimester: Secondary | ICD-10-CM

## 2020-03-26 DIAGNOSIS — O321XX0 Maternal care for breech presentation, not applicable or unspecified: Secondary | ICD-10-CM | POA: Diagnosis not present

## 2020-03-26 DIAGNOSIS — O365920 Maternal care for other known or suspected poor fetal growth, second trimester, not applicable or unspecified: Secondary | ICD-10-CM | POA: Diagnosis present

## 2020-03-26 NOTE — Procedures (Signed)
Yvette Mosley 10/24/1990 [redacted]w[redacted]d  Fetus A Non-Stress Test Interpretation for 03/26/20  Indication: IUGR  Fetal Heart Rate A Mode: External Baseline Rate (A): 145 bpm Variability: Moderate Accelerations: 10 x 10,15 x 15 Decelerations: None Multiple birth?: No  Uterine Activity Mode: Palpation,Toco Contraction Frequency (min): none noted Resting Tone Palpated: Relaxed Resting Time: Adequate  Interpretation (Fetal Testing) Nonstress Test Interpretation: Reactive Overall Impression: Reassuring for gestational age Comments: Reviewed tracing with Dr. Parke Poisson

## 2020-03-26 NOTE — Progress Notes (Signed)
C/o N/V

## 2020-03-30 ENCOUNTER — Ambulatory Visit (INDEPENDENT_AMBULATORY_CARE_PROVIDER_SITE_OTHER): Payer: Medicaid Other | Admitting: Nurse Practitioner

## 2020-03-30 ENCOUNTER — Other Ambulatory Visit: Payer: Self-pay

## 2020-03-30 ENCOUNTER — Encounter: Payer: Self-pay | Admitting: *Deleted

## 2020-03-30 ENCOUNTER — Inpatient Hospital Stay (HOSPITAL_COMMUNITY): Payer: Medicaid Other

## 2020-03-30 ENCOUNTER — Encounter: Payer: Self-pay | Admitting: Nurse Practitioner

## 2020-03-30 ENCOUNTER — Ambulatory Visit: Payer: Medicaid Other

## 2020-03-30 ENCOUNTER — Encounter (HOSPITAL_COMMUNITY): Payer: Self-pay | Admitting: Obstetrics & Gynecology

## 2020-03-30 ENCOUNTER — Encounter: Payer: Self-pay | Admitting: Family Medicine

## 2020-03-30 ENCOUNTER — Inpatient Hospital Stay (HOSPITAL_COMMUNITY)
Admission: AD | Admit: 2020-03-30 | Discharge: 2020-03-30 | Disposition: A | Discharge status: Home / Self Care | Payer: Medicaid Other | Attending: Obstetrics & Gynecology | Admitting: Obstetrics & Gynecology

## 2020-03-30 ENCOUNTER — Ambulatory Visit: Payer: Medicaid Other | Attending: Obstetrics and Gynecology

## 2020-03-30 ENCOUNTER — Telehealth (INDEPENDENT_AMBULATORY_CARE_PROVIDER_SITE_OTHER): Payer: Medicaid Other | Admitting: Family Medicine

## 2020-03-30 ENCOUNTER — Ambulatory Visit: Payer: Medicaid Other | Admitting: *Deleted

## 2020-03-30 VITALS — BP 102/62 | HR 101 | Wt 152.0 lb

## 2020-03-30 DIAGNOSIS — O26893 Other specified pregnancy related conditions, third trimester: Secondary | ICD-10-CM | POA: Diagnosis not present

## 2020-03-30 DIAGNOSIS — Z3689 Encounter for other specified antenatal screening: Secondary | ICD-10-CM

## 2020-03-30 DIAGNOSIS — O0990 Supervision of high risk pregnancy, unspecified, unspecified trimester: Secondary | ICD-10-CM

## 2020-03-30 DIAGNOSIS — Z20822 Contact with and (suspected) exposure to covid-19: Secondary | ICD-10-CM | POA: Insufficient documentation

## 2020-03-30 DIAGNOSIS — O365930 Maternal care for other known or suspected poor fetal growth, third trimester, not applicable or unspecified: Secondary | ICD-10-CM

## 2020-03-30 DIAGNOSIS — O09891 Supervision of other high risk pregnancies, first trimester: Secondary | ICD-10-CM | POA: Insufficient documentation

## 2020-03-30 DIAGNOSIS — J9811 Atelectasis: Secondary | ICD-10-CM | POA: Diagnosis not present

## 2020-03-30 DIAGNOSIS — Z3A29 29 weeks gestation of pregnancy: Secondary | ICD-10-CM | POA: Diagnosis not present

## 2020-03-30 DIAGNOSIS — Z8482 Family history of sudden infant death syndrome: Secondary | ICD-10-CM

## 2020-03-30 DIAGNOSIS — Z8759 Personal history of other complications of pregnancy, childbirth and the puerperium: Secondary | ICD-10-CM | POA: Diagnosis present

## 2020-03-30 DIAGNOSIS — Z87891 Personal history of nicotine dependence: Secondary | ICD-10-CM | POA: Insufficient documentation

## 2020-03-30 DIAGNOSIS — Z5329 Procedure and treatment not carried out because of patient's decision for other reasons: Secondary | ICD-10-CM

## 2020-03-30 DIAGNOSIS — O09213 Supervision of pregnancy with history of pre-term labor, third trimester: Secondary | ICD-10-CM

## 2020-03-30 DIAGNOSIS — O24113 Pre-existing diabetes mellitus, type 2, in pregnancy, third trimester: Secondary | ICD-10-CM | POA: Insufficient documentation

## 2020-03-30 DIAGNOSIS — Z91199 Patient's noncompliance with other medical treatment and regimen due to unspecified reason: Secondary | ICD-10-CM

## 2020-03-30 DIAGNOSIS — O09293 Supervision of pregnancy with other poor reproductive or obstetric history, third trimester: Secondary | ICD-10-CM

## 2020-03-30 DIAGNOSIS — Z8632 Personal history of gestational diabetes: Secondary | ICD-10-CM

## 2020-03-30 DIAGNOSIS — O365990 Maternal care for other known or suspected poor fetal growth, unspecified trimester, not applicable or unspecified: Secondary | ICD-10-CM | POA: Insufficient documentation

## 2020-03-30 DIAGNOSIS — R0602 Shortness of breath: Secondary | ICD-10-CM

## 2020-03-30 DIAGNOSIS — O365920 Maternal care for other known or suspected poor fetal growth, second trimester, not applicable or unspecified: Secondary | ICD-10-CM | POA: Insufficient documentation

## 2020-03-30 HISTORY — DX: Gestational diabetes mellitus in pregnancy, unspecified control: O24.419

## 2020-03-30 HISTORY — DX: Type 2 diabetes mellitus without complications: E11.9

## 2020-03-30 LAB — URINALYSIS, ROUTINE W REFLEX MICROSCOPIC
Bilirubin Urine: NEGATIVE
Glucose, UA: NEGATIVE mg/dL
Hgb urine dipstick: NEGATIVE
Ketones, ur: NEGATIVE mg/dL
Leukocytes,Ua: NEGATIVE
Nitrite: NEGATIVE
Protein, ur: NEGATIVE mg/dL
Specific Gravity, Urine: 1.014 (ref 1.005–1.030)
pH: 7 (ref 5.0–8.0)

## 2020-03-30 LAB — SARS CORONAVIRUS 2 (TAT 6-24 HRS): SARS Coronavirus 2: NEGATIVE

## 2020-03-30 MED ORDER — PROMETHAZINE HCL 25 MG RE SUPP: 25.0000 mg | Freq: Four times a day (QID) | RECTAL | 0 refills | Status: DC | PRN

## 2020-03-30 MED ORDER — SCOPOLAMINE 1 MG/3DAYS TD PT72: 1.0000 | MEDICATED_PATCH | TRANSDERMAL | 12 refills | Status: DC

## 2020-03-30 MED ORDER — ONDANSETRON 4 MG PO TBDP: 4.0000 mg | ORAL_TABLET | Freq: Three times a day (TID) | ORAL | 5 refills | Status: DC | PRN

## 2020-03-30 NOTE — Progress Notes (Signed)
Seen by Dr. Crissie Reese.  See note and visit.  Nolene Bernheim, RN, MSN, NP-BC Nurse Practitioner, [facility] for [employer], [facility] 03/30/2020 2:44 PM

## 2020-03-30 NOTE — MAU Provider Note (Signed)
History     CSN: [id number]  Arrival date and time: 03/30/20 1356   Event Date/Time   First Provider Initiated Contact with Patient 03/30/20 1441      Chief Complaint  Patient presents with  . Shortness of Breath   30 y.o. [id number] @29 .3 wks presenting with SOB. Reports several brief episodes of SOB since this am. Each only lasts a few seconds. Denies CP or pressure. Denies leg/calf pain or swelling. Denies any known Covid exposure. She has not been vaccinated. Reports good FM. Denies pregnancy concerns.   OB History    Gravida  8   Para  5   Term  4   Preterm  1   AB  2   Living  4     SAB  1   IAB  1   Ectopic      Multiple      Live Births  5           Past Medical History:  Diagnosis Date  . Diabetes mellitus without complication ([place])   . Gestational diabetes   . History of preterm delivery   . Hx gestational diabetes     Past Surgical History:  Procedure Laterality Date  . CHOLECYSTECTOMY    . DILATION AND CURETTAGE OF UTERUS  2018    Family History  Problem Relation Age of Onset  . Diabetes Mother   . Cancer Father   . COPD Father     Social History   Tobacco Use  . Smoking status: Former Smoker    Types: Cigarettes  . Smokeless tobacco: Never Used  Vaping Use  . Vaping Use: Former  Substance Use Topics  . Alcohol use: Not Currently  . Drug use: Not Currently    Allergies:  Allergies  Allergen Reactions  . Alpha-Gal Other (See Comments)    Abdominal pain   . Ceclor [Cefaclor] Hives    No medications prior to admission.    Review of Systems  Constitutional: Negative for fever.  HENT: Negative for congestion.   Respiratory: Positive for shortness of breath. Negative for cough.   Cardiovascular: Negative for chest pain.  Gastrointestinal: Negative for abdominal pain.  Genitourinary: Negative for vaginal bleeding.   Physical Exam   Blood pressure 125/64, pulse 93, temperature 97.9 F (36.6 C), temperature source  Oral, resp. rate 20, weight 69.4 kg, last menstrual period 09/06/2019, SpO2 96 %.  Physical Exam Vitals and nursing note reviewed.  Constitutional:      General: She is not in acute distress.    Appearance: Normal appearance.  HENT:     Head: Normocephalic and atraumatic.  Cardiovascular:     Rate and Rhythm: Normal rate and regular rhythm.     Heart sounds: Normal heart sounds.  Pulmonary:     Effort: Pulmonary effort is normal. No respiratory distress.     Breath sounds: Normal breath sounds. No wheezing, rhonchi or rales.  Musculoskeletal:        General: Normal range of motion.     Cervical back: Normal range of motion.  Skin:    General: Skin is warm and dry.  Neurological:     General: No focal deficit present.     Mental Status: She is alert and oriented to person, place, and time.  Psychiatric:        Mood and Affect: Mood normal.        Behavior: Behavior normal.   EFM: 140 bpm, mod variability,  accels, prolonged decel  x1 Toco: none  Results for orders placed or performed during the hospital encounter of 03/30/20 (from the past 24 hour(s))  Urinalysis, Routine w reflex microscopic Urine, Clean Catch     Status: Abnormal   Collection Time: 03/30/20  2:16 PM  Result Value Ref Range   Color, Urine YELLOW YELLOW   APPearance CLOUDY (A) CLEAR   Specific Gravity, Urine 1.014 1.005 - 1.030   pH 7.0 5.0 - 8.0   Glucose, UA NEGATIVE NEGATIVE mg/dL   Hgb urine dipstick NEGATIVE NEGATIVE   Bilirubin Urine NEGATIVE NEGATIVE   Ketones, ur NEGATIVE NEGATIVE mg/dL   Protein, ur NEGATIVE NEGATIVE mg/dL   Nitrite NEGATIVE NEGATIVE   Leukocytes,Ua NEGATIVE NEGATIVE   MAU Course  Procedures CXR Prolonged EFM  MDM Labs ordered and reviewed. No evidence of pneumonia, Covid test pending but low suspicion. Had 3 min FHR decel but reassuring fetal tracing and accels for >1 hour after.    Assessment and Plan   1. [redacted] weeks gestation of pregnancy   2. NST (non-stress test)  reactive   3. Shortness of breath due to pregnancy in third trimester    Discharge home Follow up at [facility] as scheduled Return precautions  Allergies as of 03/30/2020      Reactions   Alpha-gal Other (See Comments)   Abdominal pain    Ceclor [cefaclor] Hives      Medication List    STOP taking these medications   Blood Pressure Kit Devi   cyclobenzaprine 10 MG tablet Commonly known as: FLEXERIL   Misc. Devices Misc   ondansetron 4 MG tablet Commonly known as: ZOFRAN   scopolamine 1 MG/3DAYS Commonly known as: TRANSDERM-SCOP     TAKE these medications   ondansetron 4 MG disintegrating tablet Commonly known as: Zofran ODT Take 1 tablet (4 mg total) by mouth every 8 (eight) hours as needed for nausea or vomiting.   promethazine 25 MG tablet Commonly known as: PHENERGAN Take 25 mg by mouth every 6 (six) hours as needed for nausea or vomiting. What changed: Another medication with the same name was removed. Continue taking this medication, and follow the directions you see here.      Julianne Handler, CNM 03/30/2020, 4:21 PM

## 2020-03-30 NOTE — Patient Instructions (Signed)
 Contraception Choices Contraception, also called birth control, refers to methods or devices that prevent pregnancy. Hormonal methods Contraceptive implant A contraceptive implant is a thin, plastic tube that contains a hormone that prevents pregnancy. It is different from an intrauterine device (IUD). It is inserted into the upper part of the arm by a health care provider. Implants can be effective for up to 3 years. Progestin-only injections Progestin-only injections are injections of progestin, a synthetic form of the hormone progesterone. They are given every 3 months by a health care provider. Birth control pills Birth control pills are pills that contain hormones that prevent pregnancy. They must be taken once a day, preferably at the same time each day. A prescription is needed to use this method of contraception. Birth control patch The birth control patch contains hormones that prevent pregnancy. It is placed on the skin and must be changed once a week for three weeks and removed on the fourth week. A prescription is needed to use this method of contraception. Vaginal ring A vaginal ring contains hormones that prevent pregnancy. It is placed in the vagina for three weeks and removed on the fourth week. After that, the process is repeated with a new ring. A prescription is needed to use this method of contraception. Emergency contraceptive Emergency contraceptives prevent pregnancy after unprotected sex. They come in pill form and can be taken up to 5 days after sex. They work best the sooner they are taken after having sex. Most emergency contraceptives are available without a prescription. This method should not be used as your only form of birth control.   Barrier methods Female condom A female condom is a thin sheath that is worn over the penis during sex. Condoms keep sperm from going inside a woman's body. They can be used with a sperm-killing substance (spermicide) to increase their  effectiveness. They should be thrown away after one use. Female condom A female condom is a soft, loose-fitting sheath that is put into the vagina before sex. The condom keeps sperm from going inside a woman's body. They should be thrown away after one use. Diaphragm A diaphragm is a soft, dome-shaped barrier. It is inserted into the vagina before sex, along with a spermicide. The diaphragm blocks sperm from entering the uterus, and the spermicide kills sperm. A diaphragm should be left in the vagina for 6-8 hours after sex and removed within 24 hours. A diaphragm is prescribed and fitted by a health care provider. A diaphragm should be replaced every 1-2 years, after giving birth, after gaining more than 15 lb (6.8 kg), and after pelvic surgery. Cervical cap A cervical cap is a round, soft latex or plastic cup that fits over the cervix. It is inserted into the vagina before sex, along with spermicide. It blocks sperm from entering the uterus. The cap should be left in place for 6-8 hours after sex and removed within 48 hours. A cervical cap must be prescribed and fitted by a health care provider. It should be replaced every 2 years. Sponge A sponge is a soft, circular piece of polyurethane foam with spermicide in it. The sponge helps block sperm from entering the uterus, and the spermicide kills sperm. To use it, you make it wet and then insert it into the vagina. It should be inserted before sex, left in for at least 6 hours after sex, and removed and thrown away within 30 hours. Spermicides Spermicides are chemicals that kill or block sperm from entering the   cervix and uterus. They can come as a cream, jelly, suppository, foam, or tablet. A spermicide should be inserted into the vagina with an applicator at least 10-15 minutes before sex to allow time for it to work. The process must be repeated every time you have sex. Spermicides do not require a prescription.   Intrauterine  contraception Intrauterine device (IUD) An IUD is a T-shaped device that is put in a woman's uterus. There are two types:  Hormone IUD.This type contains progestin, a synthetic form of the hormone progesterone. This type can stay in place for 3-5 years.  Copper IUD.This type is wrapped in copper wire. It can stay in place for 10 years. Permanent methods of contraception Female tubal ligation In this method, a woman's fallopian tubes are sealed, tied, or blocked during surgery to prevent eggs from traveling to the uterus. Hysteroscopic sterilization In this method, a small, flexible insert is placed into each fallopian tube. The inserts cause scar tissue to form in the fallopian tubes and block them, so sperm cannot reach an egg. The procedure takes about 3 months to be effective. Another form of birth control must be used during those 3 months. Female sterilization This is a procedure to tie off the tubes that carry sperm (vasectomy). After the procedure, the man can still ejaculate fluid (semen). Another form of birth control must be used for 3 months after the procedure. Natural planning methods Natural family planning In this method, a couple does not have sex on days when the woman could become pregnant. Calendar method In this method, the woman keeps track of the length of each menstrual cycle, identifies the days when pregnancy can happen, and does not have sex on those days. Ovulation method In this method, a couple avoids sex during ovulation. Symptothermal method This method involves not having sex during ovulation. The woman typically checks for ovulation by watching changes in her temperature and in the consistency of cervical mucus. Post-ovulation method In this method, a couple waits to have sex until after ovulation. Where to find more information  Centers for Disease Control and Prevention: www.cdc.gov Summary  Contraception, also called birth control, refers to methods or  devices that prevent pregnancy.  Hormonal methods of contraception include implants, injections, pills, patches, vaginal rings, and emergency contraceptives.  Barrier methods of contraception can include female condoms, female condoms, diaphragms, cervical caps, sponges, and spermicides.  There are two types of IUDs (intrauterine devices). An IUD can be put in a woman's uterus to prevent pregnancy for 3-5 years.  Permanent sterilization can be done through a procedure for males and females. Natural family planning methods involve nothaving sex on days when the woman could become pregnant. This information is not intended to replace advice given to you by your health care provider. Make sure you discuss any questions you have with your health care provider. Document Revised: 07/28/2019 Document Reviewed: 07/28/2019 Elsevier Patient Education  2021 [employer] Inc.   Breastfeeding  Choosing to breastfeed is one of the best decisions you can make for yourself and your baby. A change in hormones during pregnancy causes your breasts to make breast milk in your milk-producing glands. Hormones prevent breast milk from being released before your baby is born. They also prompt milk flow after birth. Once breastfeeding has begun, thoughts of your baby, as well as his or her sucking or crying, can stimulate the release of milk from your milk-producing glands. Benefits of breastfeeding Research shows that breastfeeding offers many health benefits   for infants and mothers. It also offers a cost-free and convenient way to feed your baby. For your baby  Your first milk (colostrum) helps your baby's digestive system to function better.  Special cells in your milk (antibodies) help your baby to fight off infections.  Breastfed babies are less likely to develop asthma, allergies, obesity, or type 2 diabetes. They are also at lower risk for sudden infant death syndrome (SIDS).  Nutrients in breast milk are better  able to meet your baby's needs compared to infant formula.  Breast milk improves your baby's brain development. For you  Breastfeeding helps to create a very special bond between you and your baby.  Breastfeeding is convenient. Breast milk costs nothing and is always available at the correct temperature.  Breastfeeding helps to burn calories. It helps you to lose the weight that you gained during pregnancy.  Breastfeeding makes your uterus return faster to its size before pregnancy. It also slows bleeding (lochia) after you give birth.  Breastfeeding helps to lower your risk of developing type 2 diabetes, osteoporosis, rheumatoid arthritis, cardiovascular disease, and breast, ovarian, uterine, and endometrial cancer later in life. Breastfeeding basics Starting breastfeeding  Find a comfortable place to sit or lie down, with your neck and back well-supported.  Place a pillow or a rolled-up blanket under your baby to bring him or her to the level of your breast (if you are seated). Nursing pillows are specially designed to help support your arms and your baby while you breastfeed.  Make sure that your baby's tummy (abdomen) is facing your abdomen.  Gently massage your breast. With your fingertips, massage from the outer edges of your breast inward toward the nipple. This encourages milk flow. If your milk flows slowly, you may need to continue this action during the feeding.  Support your breast with 4 fingers underneath and your thumb above your nipple (make the letter "C" with your hand). Make sure your fingers are well away from your nipple and your baby's mouth.  Stroke your baby's lips gently with your finger or nipple.  When your baby's mouth is open wide enough, quickly bring your baby to your breast, placing your entire nipple and as much of the areola as possible into your baby's mouth. The areola is the colored area around your nipple. ? More areola should be visible above your  baby's upper lip than below the lower lip. ? Your baby's lips should be opened and extended outward (flanged) to ensure an adequate, comfortable latch. ? Your baby's tongue should be between his or her lower gum and your breast.  Make sure that your baby's mouth is correctly positioned around your nipple (latched). Your baby's lips should create a seal on your breast and be turned out (everted).  It is common for your baby to suck about 2-3 minutes in order to start the flow of breast milk. Latching Teaching your baby how to latch onto your breast properly is very important. An improper latch can cause nipple pain, decreased milk supply, and poor weight gain in your baby. Also, if your baby is not latched onto your nipple properly, he or she may swallow some air during feeding. This can make your baby fussy. Burping your baby when you switch breasts during the feeding can help to get rid of the air. However, teaching your baby to latch on properly is still the best way to prevent fussiness from swallowing air while breastfeeding. Signs that your baby has successfully latched onto   your nipple  Silent tugging or silent sucking, without causing you pain. Infant's lips should be extended outward (flanged).  Swallowing heard between every 3-4 sucks once your milk has started to flow (after your let-down milk reflex occurs).  Muscle movement above and in front of his or her ears while sucking. Signs that your baby has not successfully latched onto your nipple  Sucking sounds or smacking sounds from your baby while breastfeeding.  Nipple pain. If you think your baby has not latched on correctly, slip your finger into the corner of your baby's mouth to break the suction and place it between your baby's gums. Attempt to start breastfeeding again. Signs of successful breastfeeding Signs from your baby  Your baby will gradually decrease the number of sucks or will completely stop sucking.  Your baby  will fall asleep.  Your baby's body will relax.  Your baby will retain a small amount of milk in his or her mouth.  Your baby will let go of your breast by himself or herself. Signs from you  Breasts that have increased in firmness, weight, and size 1-3 hours after feeding.  Breasts that are softer immediately after breastfeeding.  Increased milk volume, as well as a change in milk consistency and color by the fifth day of breastfeeding.  Nipples that are not sore, cracked, or bleeding. Signs that your baby is getting enough milk  Wetting at least 1-2 diapers during the first 24 hours after birth.  Wetting at least 5-6 diapers every 24 hours for the first week after birth. The urine should be clear or pale yellow by the age of 5 days.  Wetting 6-8 diapers every 24 hours as your baby continues to grow and develop.  At least 3 stools in a 24-hour period by the age of 5 days. The stool should be soft and yellow.  At least 3 stools in a 24-hour period by the age of 7 days. The stool should be seedy and yellow.  No loss of weight greater than 10% of birth weight during the first 3 days of life.  Average weight gain of 4-7 oz (113-198 g) per week after the age of 4 days.  Consistent daily weight gain by the age of 5 days, without weight loss after the age of 2 weeks. After a feeding, your baby may spit up a small amount of milk. This is normal. Breastfeeding frequency and duration Frequent feeding will help you make more milk and can prevent sore nipples and extremely full breasts (breast engorgement). Breastfeed when you feel the need to reduce the fullness of your breasts or when your baby shows signs of hunger. This is called "breastfeeding on demand." Signs that your baby is hungry include:  Increased alertness, activity, or restlessness.  Movement of the head from side to side.  Opening of the mouth when the corner of the mouth or cheek is stroked (rooting).  Increased  sucking sounds, smacking lips, cooing, sighing, or squeaking.  Hand-to-mouth movements and sucking on fingers or hands.  Fussing or crying. Avoid introducing a pacifier to your baby in the first 4-6 weeks after your baby is born. After this time, you may choose to use a pacifier. Research has shown that pacifier use during the first year of a baby's life decreases the risk of sudden infant death syndrome (SIDS). Allow your baby to feed on each breast as long as he or she wants. When your baby unlatches or falls asleep while feeding from the   first breast, offer the second breast. Because newborns are often sleepy in the first few weeks of life, you may need to awaken your baby to get him or her to feed. Breastfeeding times will vary from baby to baby. However, the following rules can serve as a guide to help you make sure that your baby is properly fed:  Newborns (babies 4 weeks of age or younger) may breastfeed every 1-3 hours.  Newborns should not go without breastfeeding for longer than 3 hours during the day or 5 hours during the night.  You should breastfeed your baby a minimum of 8 times in a 24-hour period. Breast milk pumping Pumping and storing breast milk allows you to make sure that your baby is exclusively fed your breast milk, even at times when you are unable to breastfeed. This is especially important if you go back to work while you are still breastfeeding, or if you are not able to be present during feedings. Your lactation consultant can help you find a method of pumping that works best for you and give you guidelines about how long it is safe to store breast milk.      Caring for your breasts while you breastfeed Nipples can become dry, cracked, and sore while breastfeeding. The following recommendations can help keep your breasts moisturized and healthy:  Avoid using soap on your nipples.  Wear a supportive bra designed especially for nursing. Avoid wearing underwire-style  bras or extremely tight bras (sports bras).  Air-dry your nipples for 3-4 minutes after each feeding.  Use only cotton bra pads to absorb leaked breast milk. Leaking of breast milk between feedings is normal.  Use lanolin on your nipples after breastfeeding. Lanolin helps to maintain your skin's normal moisture barrier. Pure lanolin is not harmful (not toxic) to your baby. You may also hand express a few drops of breast milk and gently massage that milk into your nipples and allow the milk to air-dry. In the first few weeks after giving birth, some women experience breast engorgement. Engorgement can make your breasts feel heavy, warm, and tender to the touch. Engorgement peaks within 3-5 days after you give birth. The following recommendations can help to ease engorgement:  Completely empty your breasts while breastfeeding or pumping. You may want to start by applying warm, moist heat (in the shower or with warm, water-soaked hand towels) just before feeding or pumping. This increases circulation and helps the milk flow. If your baby does not completely empty your breasts while breastfeeding, pump any extra milk after he or she is finished.  Apply ice packs to your breasts immediately after breastfeeding or pumping, unless this is too uncomfortable for you. To do this: ? Put ice in a plastic bag. ? Place a towel between your skin and the bag. ? Leave the ice on for 20 minutes, 2-3 times a day.  Make sure that your baby is latched on and positioned properly while breastfeeding. If engorgement persists after 48 hours of following these recommendations, contact your health care provider or a lactation consultant. Overall health care recommendations while breastfeeding  Eat 3 healthy meals and 3 snacks every day. Well-nourished mothers who are breastfeeding need an additional 450-500 calories a day. You can meet this requirement by increasing the amount of a balanced diet that you eat.  Drink  enough water to keep your urine pale yellow or clear.  Rest often, relax, and continue to take your prenatal vitamins to prevent fatigue, stress, and low   vitamin and mineral levels in your body (nutrient deficiencies).  Do not use any products that contain nicotine or tobacco, such as cigarettes and e-cigarettes. Your baby may be harmed by chemicals from cigarettes that pass into breast milk and exposure to secondhand smoke. If you need help quitting, ask your health care provider.  Avoid alcohol.  Do not use illegal drugs or marijuana.  Talk with your health care provider before taking any medicines. These include over-the-counter and prescription medicines as well as vitamins and herbal supplements. Some medicines that may be harmful to your baby can pass through breast milk.  It is possible to become pregnant while breastfeeding. If birth control is desired, ask your health care provider about options that will be safe while breastfeeding your baby. Where to find more information: [employer]: www.llli.org Contact a health care provider if:  You feel like you want to stop breastfeeding or have become frustrated with breastfeeding.  Your nipples are cracked or bleeding.  Your breasts are red, tender, or warm.  You have: ? Painful breasts or nipples. ? A swollen area on either breast. ? A fever or chills. ? Nausea or vomiting. ? Drainage other than breast milk from your nipples.  Your breasts do not become full before feedings by the fifth day after you give birth.  You feel sad and depressed.  Your baby is: ? Too sleepy to eat well. ? Having trouble sleeping. ? More than 1 week old and wetting fewer than 6 diapers in a 24-hour period. ? Not gaining weight by 5 days of age.  Your baby has fewer than 3 stools in a 24-hour period.  Your baby's skin or the white parts of his or her eyes become yellow. Get help right away if:  Your baby is overly tired  (lethargic) and does not want to wake up and feed.  Your baby develops an unexplained fever. Summary  Breastfeeding offers many health benefits for infant and mothers.  Try to breastfeed your infant when he or she shows early signs of hunger.  Gently tickle or stroke your baby's lips with your finger or nipple to allow the baby to open his or her mouth. Bring the baby to your breast. Make sure that much of the areola is in your baby's mouth. Offer one side and burp the baby before you offer the other side.  Talk with your health care provider or lactation consultant if you have questions or you face problems as you breastfeed. This information is not intended to replace advice given to you by your health care provider. Make sure you discuss any questions you have with your health care provider. Document Revised: 05/17/2017 Document Reviewed: 03/24/2016 Elsevier Patient Education  2021 Elsevier Inc.  

## 2020-03-30 NOTE — MAU Note (Signed)
Patient states the office sent her for SOB.  Started having it this morning, states it comes and goes.  Denies chest pain/congestion/fevers.  When she woke up yesterday she "felt like everything was in slow motion."  Denies CTX/LOF/VB.  + FM.

## 2020-03-30 NOTE — Discharge Instructions (Signed)
Shortness of Breath, Adult Shortness of breath means you have trouble breathing. Shortness of breath could be a sign of a medical problem. Follow these instructions at home:  Watch for any changes in your symptoms.  Do not use any products that contain nicotine or tobacco, such as cigarettes, e-cigarettes, and chewing tobacco.  Do not smoke. Smoking can cause shortness of breath. If you need help to quit smoking, ask your doctor.  Avoid things that can make it harder to breathe, such as: ? Mold. ? Dust. ? Air pollution. ? Chemical smells. ? Things that can cause allergy symptoms (allergens), if you have allergies.  Keep your living space clean. Use products that help remove mold and dust.  Rest as needed. Slowly return to your normal activities.  Take over-the-counter and prescription medicines only as told by your doctor. This includes oxygen therapy and inhaled medicines.  Keep all follow-up visits as told by your doctor. This is important.   Contact a doctor if:  Your condition does not get better as soon as expected.  You have a hard time doing your normal activities, even after you rest.  You have new symptoms. Get help right away if:  Your shortness of breath gets worse.  You have trouble breathing when you are resting.  You feel light-headed or you pass out (faint).  You have a cough that is not helped by medicines.  You cough up blood.  You have pain with breathing.  You have pain in your chest, arms, shoulders, or belly (abdomen).  You have a fever.  You cannot walk up stairs.  You cannot exercise the way you normally do. These symptoms may represent a serious problem that is an emergency. Do not wait to see if the symptoms will go away. Get medical help right away. Call your local emergency services (911 in the [place] Do not drive yourself to the hospital. Summary  Shortness of breath is when you have trouble breathing enough air. It can be a sign of a  medical problem.  Avoid things that make it hard for you to breathe, such as smoking, pollution, mold, and dust.  Watch for any changes in your symptoms. Contact your doctor if you do not get better or you get worse. This information is not intended to replace advice given to you by your health care provider. Make sure you discuss any questions you have with your health care provider. Document Revised: 07/23/2017 Document Reviewed: 07/23/2017 Elsevier Patient Education  2021 [employer] Inc.  

## 2020-03-30 NOTE — Progress Notes (Signed)
Subjective:  Yvette Mosley is a 30 y.o. [phone] at [redacted]w[redacted]d being seen today for ongoing prenatal care.  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  Patient reports nausea.  Contractions: Not present. Vag. Bleeding: None.  Movement: Present. Denies leaking of fluid.   Has tried zofran, phenergan, and unisom individually, does not feel that any of them have worked well. Has not tried any of them at the same time in combination. Eats about one meal a day. Also had episode of feeling very strange yesterday like everything "was in slow motion".   Has also been having sharp abdominal pains that last about a second.   The following portions of the patient's history were reviewed and updated as appropriate: allergies, current medications, past family history, past medical history, past social history, past surgical history and problem list. Problem list updated.  Objective:   Vitals:   03/30/20 1202  BP: 102/62  Pulse: (!) 101  Weight: 152 lb (68.9 kg)    Fetal Status: Fetal Heart Rate (bpm): 142   Movement: Present     General:  Alert, oriented and cooperative. Patient is in no acute distress.  Skin: Skin is warm and dry. No rash noted.   Cardiovascular: Normal heart rate noted  Respiratory: Normal respiratory effort, no problems with respiration noted  Abdomen: Soft, gravid, appropriate for gestational age. Pain/Pressure: Present     Pelvic: Vag. Bleeding: None     Cervical exam  deferred        Extremities: Normal range of motion.  Edema: None  Mental Status: Normal mood and affect. Normal behavior. Normal judgment and thought content.   Urinalysis:      Assessment and Plan:  Pregnancy: [id number] at [redacted]w[redacted]d  1. History of pre-eclampsia BP normal today  2. Short interval between pregnancies affecting pregnancy in first trimester, antepartum   3. Supervision of high risk pregnancy, antepartum BP and FHR normal Significant symptoms of nausea today Discussed trialing combination of medications and taking them on a regimen to see if this improves symptoms, recommended scopolamine/zofran/phenergan trial Also discussed importance of grazing, not letting her stomach be empty Also appears very anxious, recommended referral to [facility], she declines at this time Also reviewed unusual symptoms she had yesterday, unsure of etiology but main culprit likely lack of adequate hydration/nutrition from n/v, recommended treating that and monitoring symptoms Also counseled on etiology and benign nature of round ligament pain  4. History of gestational diabetes Normal 2hr gtt at last visit  5. Family history of SIDS (sudden infant death syndrome)  6. IUGR New diagnosis since last [place], patient unclear as to why she needs more [place] EFW 5% on last scan with normal dopplers, engaged in weekly antenatal testing w MFM Discussed increased risk for stillbirth as indication for increased antenatal testing and possibility of early IOL pending follow up studies  Preterm labor symptoms and general obstetric precautions including but not limited to vaginal bleeding, contractions, leaking of fluid and fetal movement were reviewed in detail with the patient. Please refer to After Visit Summary for other counseling recommendations.  Return in 2 weeks (on 04/13/2020)  for [facility], ob visit.   Venora Maples, MD

## 2020-04-01 ENCOUNTER — Telehealth: Payer: Self-pay | Admitting: Family Medicine

## 2020-04-01 NOTE — Telephone Encounter (Addendum)
Called pt and left message stating that I am returning her call regarding a prescription problem. I will call her pharmacy to discuss and someone will call her back tomorrow. I then called [facility] [phone] and was told that pt had prescriptions for Scopolamine, Ondansetron and Phenergan all sent in on 1/25. Per chart notes by Donette Larry @ MAU visit, pt was advised to stop taking Scopolamine and take Ondansetron and Phenergan for nausea/vomiting. I provided this clarification to pharmacy staff after consulting with Dr. Donavan Foil in office. At that point, the employee noted that they cannot fill any prescriptions for pt because she has [place] Medicaid and they are located in [place]. I stated that I would call the pt tomorrow and then call them back. All prescriptions were placed on hold until further information is obtained.   1/28  1315  Called pt and she informed me that her prescriptions were transferred to [facility] in [place], [place] and that there was still a problem with obtaining them. I called the pharmacy and was informed that she has 3 prescriptions ready for pick up. I sent a  Message to Dr. Crissie Reese asking for clarification if pt should take all meds (Scopolamine, Phenergan and Ondansetron) for her nausea or just Ondansetron and Phenergan as per note from Donette Larry on 1/25. He will send Mychart message to pt with instructions. I called pt back and informed her of prescriptions are ready and to watch for a message from Dr. Crissie Reese. She voiced understanding.

## 2020-04-01 NOTE — Telephone Encounter (Signed)
Pt needs a call back ASAP. Pt stating that the pharmacy is having trouble filling the prescription and needs someone to call her so that she can get medication.  Stated that the pharmacy said that the medications have conflicts with other meds and will not fill until clarification from office. Please call pt back ASAP. thanks

## 2020-04-06 ENCOUNTER — Ambulatory Visit: Payer: Medicaid Other | Admitting: *Deleted

## 2020-04-06 ENCOUNTER — Encounter: Payer: Self-pay | Admitting: *Deleted

## 2020-04-06 ENCOUNTER — Ambulatory Visit (HOSPITAL_BASED_OUTPATIENT_CLINIC_OR_DEPARTMENT_OTHER): Payer: Medicaid Other | Admitting: *Deleted

## 2020-04-06 ENCOUNTER — Other Ambulatory Visit: Payer: Self-pay | Admitting: *Deleted

## 2020-04-06 ENCOUNTER — Other Ambulatory Visit: Payer: Self-pay

## 2020-04-06 ENCOUNTER — Ambulatory Visit: Payer: Medicaid Other | Attending: Obstetrics and Gynecology

## 2020-04-06 DIAGNOSIS — O365930 Maternal care for other known or suspected poor fetal growth, third trimester, not applicable or unspecified: Secondary | ICD-10-CM | POA: Diagnosis not present

## 2020-04-06 DIAGNOSIS — O09293 Supervision of pregnancy with other poor reproductive or obstetric history, third trimester: Secondary | ICD-10-CM

## 2020-04-06 DIAGNOSIS — O365920 Maternal care for other known or suspected poor fetal growth, second trimester, not applicable or unspecified: Secondary | ICD-10-CM | POA: Diagnosis not present

## 2020-04-06 DIAGNOSIS — O0990 Supervision of high risk pregnancy, unspecified, unspecified trimester: Secondary | ICD-10-CM | POA: Insufficient documentation

## 2020-04-06 DIAGNOSIS — O365931 Maternal care for other known or suspected poor fetal growth, third trimester, fetus 1: Secondary | ICD-10-CM | POA: Insufficient documentation

## 2020-04-06 DIAGNOSIS — O365990 Maternal care for other known or suspected poor fetal growth, unspecified trimester, not applicable or unspecified: Secondary | ICD-10-CM | POA: Diagnosis not present

## 2020-04-06 DIAGNOSIS — O09891 Supervision of other high risk pregnancies, first trimester: Secondary | ICD-10-CM | POA: Insufficient documentation

## 2020-04-06 DIAGNOSIS — Z3A30 30 weeks gestation of pregnancy: Secondary | ICD-10-CM

## 2020-04-06 DIAGNOSIS — Z8759 Personal history of other complications of pregnancy, childbirth and the puerperium: Secondary | ICD-10-CM | POA: Insufficient documentation

## 2020-04-06 NOTE — Procedures (Signed)
Yvette Mosley 02/20/1991 [redacted]w[redacted]d  Fetus A Non-Stress Test Interpretation for 04/06/20  Indication: IUGR  Fetal Heart Rate A Mode: External Baseline Rate (A): 145 bpm Variability: Moderate Accelerations: 15 x 15 Decelerations: None Multiple birth?: No  Uterine Activity Mode: Palpation,Toco Contraction Frequency (min): none Resting Tone Palpated: Relaxed Resting Time: Adequate  Interpretation (Fetal Testing) Nonstress Test Interpretation: Reactive Overall Impression: Reassuring for gestational age Comments: Dr. Parke Poisson reviewed tracing.

## 2020-04-08 ENCOUNTER — Inpatient Hospital Stay (HOSPITAL_COMMUNITY)
Admission: AD | Admit: 2020-04-08 | Discharge: 2020-04-08 | Disposition: A | Discharge status: Home / Self Care | Payer: Medicaid Other | Attending: Obstetrics and Gynecology | Admitting: Obstetrics and Gynecology

## 2020-04-08 ENCOUNTER — Other Ambulatory Visit: Payer: Self-pay

## 2020-04-08 ENCOUNTER — Encounter (HOSPITAL_COMMUNITY): Payer: Self-pay | Admitting: Obstetrics and Gynecology

## 2020-04-08 DIAGNOSIS — Z3A30 30 weeks gestation of pregnancy: Secondary | ICD-10-CM | POA: Diagnosis not present

## 2020-04-08 DIAGNOSIS — O99613 Diseases of the digestive system complicating pregnancy, third trimester: Secondary | ICD-10-CM | POA: Diagnosis not present

## 2020-04-08 DIAGNOSIS — Z87891 Personal history of nicotine dependence: Secondary | ICD-10-CM | POA: Insufficient documentation

## 2020-04-08 DIAGNOSIS — O09293 Supervision of pregnancy with other poor reproductive or obstetric history, third trimester: Secondary | ICD-10-CM | POA: Diagnosis not present

## 2020-04-08 DIAGNOSIS — O09891 Supervision of other high risk pregnancies, first trimester: Secondary | ICD-10-CM

## 2020-04-08 DIAGNOSIS — K5900 Constipation, unspecified: Secondary | ICD-10-CM | POA: Insufficient documentation

## 2020-04-08 DIAGNOSIS — O4693 Antepartum hemorrhage, unspecified, third trimester: Secondary | ICD-10-CM | POA: Diagnosis not present

## 2020-04-08 DIAGNOSIS — O0990 Supervision of high risk pregnancy, unspecified, unspecified trimester: Secondary | ICD-10-CM

## 2020-04-08 DIAGNOSIS — Z8759 Personal history of other complications of pregnancy, childbirth and the puerperium: Secondary | ICD-10-CM

## 2020-04-08 DIAGNOSIS — R1030 Lower abdominal pain, unspecified: Secondary | ICD-10-CM | POA: Insufficient documentation

## 2020-04-08 DIAGNOSIS — Z881 Allergy status to other antibiotic agents status: Secondary | ICD-10-CM | POA: Diagnosis not present

## 2020-04-08 DIAGNOSIS — Z3689 Encounter for other specified antenatal screening: Secondary | ICD-10-CM

## 2020-04-08 DIAGNOSIS — O26893 Other specified pregnancy related conditions, third trimester: Secondary | ICD-10-CM | POA: Insufficient documentation

## 2020-04-08 DIAGNOSIS — O4690 Antepartum hemorrhage, unspecified, unspecified trimester: Secondary | ICD-10-CM

## 2020-04-08 DIAGNOSIS — Z6790 Unspecified blood type, Rh positive: Secondary | ICD-10-CM

## 2020-04-08 LAB — URINALYSIS, ROUTINE W REFLEX MICROSCOPIC
Bilirubin Urine: NEGATIVE
Glucose, UA: NEGATIVE mg/dL
Hgb urine dipstick: NEGATIVE
Ketones, ur: NEGATIVE mg/dL
Leukocytes,Ua: NEGATIVE
Nitrite: NEGATIVE
Protein, ur: NEGATIVE mg/dL
Specific Gravity, Urine: 1.011 (ref 1.005–1.030)
pH: 7 (ref 5.0–8.0)

## 2020-04-08 NOTE — MAU Provider Note (Addendum)
Chief Complaint:  Constipation   Event Date/Time   First Provider Initiated Contact with Patient 04/08/20 1652      HPI: Yvette Mosley is a 30 y.o. [id number] at [redacted]w[redacted]d who presents to maternity admissions reporting constipation and lower abdominal pain x4 days. She used a fleet enema yesterday and had no success with having a bowel movement. Patient reports no passage of stool for the past 4 days, but did have a normal bowel movement 4 days ago. She reports significant nausea and vomiting throughout this pregnancy that she was recently prescribed zofran, phenergan, and scopolamine patches which she has been taking all of them around the clock and is not complaining of nausea and vomiting today. She started these medications about 4 days ago. She reports that the abdominal pain is sharp and it comes and goes. She has not used any other medications for the constipation other than the enema. She denies h/a, dizziness, or fever/chills.  Patient also reports she wears daily tampons because if she does not, her vaginal discharge gives her yeast infections. Patient reports yesterday when she removed her tampon, there was some blood on the end of the tampon. Patient denies bleeding today or at other times this pregnancy.    Past Medical History: Past Medical History:  Diagnosis Date  . Diabetes mellitus without complication (HCC)   . Gestational diabetes   . History of preterm delivery   . Hx gestational diabetes     Past obstetric history: OB History  Gravida Para Term Preterm AB Living  8 5 4 1 2 4   SAB IAB Ectopic Multiple Live Births  1 1     5     # Outcome Date GA Lbr Len/2nd Weight Sex Delivery Anes PTL Lv  8 Current           7 Preterm 01/25/19 [redacted]w[redacted]d  2900 g M Vag-Spont EPI Y LIV     Complications: Non-reassuring electronic fetal monitoring tracing  6 Term 03/11/18 [redacted]w[redacted]d  3005 g M Vag-Spont  N LIV  5 IAB 04/2017 [redacted]w[redacted]d    TAB     4 SAB 06/2016          3 Term 11/05/15 [redacted]w[redacted]d  3544 g  F Vag-Spont   DEC     Complications: SIDS (sudden infant death syndrome)  2 Term 08-Jul-2014 [redacted]w[redacted]d  3572 g M Vag-Spont   LIV  1 Term 07/19/10 [redacted]w[redacted]d  3175 g F Vag-Spont   LIV     Complications: Vacuum extractor delivery, delivered    Past Surgical History: Past Surgical History:  Procedure Laterality Date  . CHOLECYSTECTOMY    . DILATION AND CURETTAGE OF UTERUS  2018    Family History: Family History  Problem Relation Age of Onset  . Diabetes Mother   . Cancer Father   . COPD Father     Social History: Social History   Tobacco Use  . Smoking status: Former Smoker    Types: Cigarettes  . Smokeless tobacco: Never Used  Vaping Use  . Vaping Use: Former  . Substances: Flavoring  Substance Use Topics  . Alcohol use: Not Currently  . Drug use: Not Currently    Allergies:  Allergies  Allergen Reactions  . Alpha-Gal Other (See Comments)    Abdominal pain   . Ceclor [Cefaclor] Hives    Meds:  No medications prior to admission.    ROS:  Review of Systems  Gastrointestinal: Positive for abdominal pain (generalized to LLQ) and constipation. Negative for  diarrhea, nausea and vomiting.  Genitourinary: Positive for vaginal bleeding (spotting yesterday on tampon size of a quarter- red and brown blood).  Neurological: Negative for headaches.    I have reviewed patient's Past Medical Hx, Surgical Hx, Family Hx, Social Hx, medications and allergies.   Physical Exam   Patient Vitals for the past 24 hrs:  BP Temp Pulse Resp Height Weight  04/08/20 1910 116/67 - (!) 104 - - -  04/08/20 1622 107/62 98.2 F (36.8 C) (!) 118 18 5\' 3"  (1.6 m) 72.6 kg    Physical Exam Exam conducted with a chaperone present.  Constitutional:      General: She is not in acute distress.    Appearance: Normal appearance. She is not ill-appearing, toxic-appearing or diaphoretic.  HENT:     Head: Normocephalic and atraumatic.  Cardiovascular:     Rate and Rhythm: Normal rate.     Pulses:  Normal pulses.  Pulmonary:     Effort: Pulmonary effort is normal.  Abdominal:     Palpations: Abdomen is soft. There is no mass.     Tenderness: There is abdominal tenderness (LLQ tenderness). There is no guarding.  Genitourinary:    General: Normal vulva.     Labia:        Right: No rash, tenderness or lesion.        Left: No rash, tenderness or lesion.      Vagina: Normal.     Cervix: Normal.     Comments: White discharge, without brown or red tinge present in vagina, cervix visually closed. Musculoskeletal:     Right lower leg: No edema.     Left lower leg: No edema.  Neurological:     Mental Status: She is alert and oriented to person, place, and time.  Psychiatric:        Mood and Affect: Mood normal.        Behavior: Behavior normal.        Thought Content: Thought content normal.        Judgment: Judgment normal.    PELVIC EXAM: done by NP  FHT:  Baseline 140/150 bpm, moderate variability, accelerations present, no decelerations Contractions: quiet   Labs: Results for orders placed or performed during the hospital encounter of 04/08/20 (from the past 24 hour(s))  Urinalysis, Routine w reflex microscopic Urine, Clean Catch     Status: None   Collection Time: 04/08/20  4:35 PM  Result Value Ref Range   Color, Urine YELLOW YELLOW   APPearance CLEAR CLEAR   Specific Gravity, Urine 1.011 1.005 - 1.030   pH 7.0 5.0 - 8.0   Glucose, UA NEGATIVE NEGATIVE mg/dL   Hgb urine dipstick NEGATIVE NEGATIVE   Bilirubin Urine NEGATIVE NEGATIVE   Ketones, ur NEGATIVE NEGATIVE mg/dL   Protein, ur NEGATIVE NEGATIVE mg/dL   Nitrite NEGATIVE NEGATIVE   Leukocytes,Ua NEGATIVE NEGATIVE      MAU Course/MDM: Orders Placed This Encounter  Procedures  . Urinalysis, Routine w reflex microscopic Urine, Clean Catch  . Soap suds enema  . Discharge patient    No orders of the defined types were placed in this encounter.  Reviewed labs, no concerning findings.   Soap suds enema  given, pt only needed 06/06/20 before being able to completely empty bowels. Pain resolved after bowel movement.  Speculum exam, no concerning findings. No evidence of bleeding.   Assessment: 1. Constipation, unspecified constipation type   2. History of pre-eclampsia   3. Short interval between pregnancies  affecting pregnancy in first trimester, antepartum   4. Supervision of high risk pregnancy, antepartum   5. [redacted] weeks gestation of pregnancy   6. NST (non-stress test) reactive   7. Vaginal bleeding in pregnancy   8. Blood type, Rh positive     Plan:  -Discharge home.  -Patient educated on Miralax clear-out as needed, and daily medications to take to prevent constipation, especially in light of addition of constipation-inducing medications. Regimen explained. Educated on anticholinergic effects of the antiemetics she is talking and explained she may need to regularly take stool softeners if she continues to take these medications.  -ED and labor precautions given.  -pt discharged to home in stable condition   Allergies as of 04/08/2020      Reactions   Alpha-gal Other (See Comments)   Abdominal pain    Ceclor [cefaclor] Hives      Medication List    TAKE these medications   ondansetron 4 MG disintegrating tablet Commonly known as: Zofran ODT Take 1 tablet (4 mg total) by mouth every 8 (eight) hours as needed for nausea or vomiting.   promethazine 25 MG tablet Commonly known as: PHENERGAN Take 25 mg by mouth every 6 (six) hours as needed for nausea or vomiting.   scopolamine 1 MG/3DAYS Commonly known as: TRANSDERM-SCOP Place 1 patch onto the skin every 3 (three) days.      Ileana Roup PA-S  04/08/2020   Attestation of Supervision of Student:  I confirm that I have verified the information documented in the physician assistant student's note and that I have also personally performed/ reperformed the history, physical exam and all medical decision making activities.  I  have verified that all services and findings are accurately documented in this student's note; and I agree with management and plan as outlined in the documentation. I have also made any necessary editorial changes.  Pelvic exam performed by NP  Marylen Ponto, NP [facility], [facility] 04/08/2020 7:50 PM

## 2020-04-08 NOTE — MAU Note (Signed)
Pt reports she has not been able to have a BM for about 3 days. Tried using an enema yesterday and did not have result. C/O pain/crampig in lower abd since yesterday.  Good fetal movement felt. Had some spotting yesterday none today.

## 2020-04-08 NOTE — Discharge Instructions (Signed)
You have constipation which is hard stools that are difficult to pass. It is important to have regular bowel movements every 1-3 days that are soft and easy to pass. Hard stools increase your risk of hemorrhoids and are very uncomfortable.   To prevent constipation you can increase the amount of fiber in your diet. Examples of foods with fiber are leafy greens, whole grain breads, oatmeal and other grains.  It is also important to drink at least eight 8oz glass of water everyday.   If you have not has a bowel movement in 4-5 days you made need to clean out your bowel.  This will have establish normal movement through your bowel.    Miralax Clean out  Take 8 capfuls of miralax in 64 oz of gatorade. You can use any fluid that appeals to you (gatorade, water, juice)  Continue to drink at least eight 8 oz glasses of water throughout the day  You can repeat with another 8 capfuls of miralax in 64 oz of gatorade if you are not having a large amount of stools  You will need to be at home and close to a bathroom for about 8 hours when you do the above as you may need to go to the bathroom frequently.   After you are cleaned out: - Start Colace100mg  twice daily - Start Miralax once daily - Start a daily fiber supplement like metamucil or citrucel - You can safely use enemas in pregnancy  - if you are having diarrhea you can reduce to Colace once a day or miralax every other day or a 1/2 capful daily.         Vaginal Bleeding During Pregnancy, Third Trimester A small amount of bleeding from the vagina, or spotting, is common during pregnancy. Sometimes bleeding is normal and is not a problem. However, bleeding during the third trimester can also be a sign of something serious for the mother and the baby. Some normal things may cause bleeding or spotting during the third trimester. They include:  Rapid changes in blood vessels. This is caused by changes that are happening to the body during  pregnancy.  Sex.  Pelvic exams. Some abnormal causes of vaginal bleeding during the third trimester include:  Infection in the cervix.  Growths on the cervix. The growths on the cervix are also called polyps.  A condition in which the placenta partially or completely covers the opening of the cervix inside the uterus (placenta previa).  The placenta separating from the uterus (placenta abruption).  Early labor, also called preterm labor.  A condition in which the placenta grows into the muscle of the uterus (placenta accreta). Tell your health care provider about any vaginal bleeding right away. Follow these instructions at home: Monitoring your bleeding  Pay attention to any changes in your symptoms. Let your health care provider know about any concerns.  Try to understand when the bleeding occurs. Does the bleeding start on its own, or does it start after something is done, such as sex or a pelvic exam?  Use a diary to record the things you see about your bleeding, including: ? The kind of bleeding you are having. Does the bleeding start and stop irregularly, or is it a constant flow? ? The severity of the bleeding. Is the bleeding heavy or light? ? The number of pads you use each day, how often you change them, and how soaked they are.  Tell your health care provider if you pass tissue.  He or she may want to see it.   Activity  Follow instructions from your health care provider about limiting your activity. If your health care provider recommends activity restriction, you may need to stay in bed and only get up to use the bathroom. In some cases, your health care provider may allow you to continue light activity.  Ask your health care provider if it is safe for you to drive.  Do not lift anything that is heavier than 10 lb (4.5 kg), or the limit that you are told, until your health care provider says that it is safe.  Do not have sex until your health care provider says  that this is safe.  If needed, make plans for someone to help with your regular activities. Medicines  Take over-the-counter and prescription medicines only as told by your health care provider.  Do not take aspirin because it can cause bleeding. General instructions  Do not use tampons or douche.  Keep all follow-up visits. This is important. Contact a health care provider if:  You have vaginal bleeding during any part of your pregnancy.  You have cramps or labor pains.  You have a fever. Get help right away if:  You have severe cramps or pain in your back or abdomen.  You have a gush of fluid from the vagina.  Your bleeding increases or you pass large clots or a large amount of tissue from your vagina.  You feel light-headed or weak, or you faint.  You feel that your baby is moving less than usual, or not moving at all. Summary  Some normal things can cause bleeding or spotting in pregnancy.  Bleeding during the third trimester can be a sign of a serious problem for the mother and the baby.  Be sure to tell your health care provider about any vaginal bleeding right away. This information is not intended to replace advice given to you by your health care provider. Make sure you discuss any questions you have with your health care provider. Document Revised: 11/13/2019 Document Reviewed: 11/13/2019 Elsevier Patient Education  2021 Else[employer] Inc[employer]        Preterm Labor The normal length of a pregnancy is 39-41 weeks. Preterm labor is when labor starts before 37 completed weeks of pregnancy. Babies who are born prematurely and survive may not be fully developed and may be at an increased risk for long-term problems such as cerebral palsy, developmental delays, and vision and hearing problems. Babies who are born too early may have problems soon after birth. Problems may include regulating blood sugar, body temperature, heart rate, and breathing rate. These babies often  have trouble with feeding. The risk of having problems is highest for babies who are born before 34 weeks of pregnancy. What are the causes? The exact cause of this condition is not known. What increases the risk? You are more likely to have preterm labor if you have certain risk factors that relate to your medical history, problems with present and past pregnancies, and lifestyle factors. Medical history  You have abnormalities of the uterus, including a short cervix.  You have STIs (sexually transmitted infections), or other infections of the urinary tract and the vagina.  You have chronic illnesses, such as blood clotting problems, diabetes, or high blood pressure.  You are overweight or underweight. Present and past pregnancies  You have had preterm labor before.  You are pregnant with twins or other multiples.  You have been diagnosed with a  condition in which the placenta covers your cervix (placenta previa).  You waited less than 6 months between giving birth and becoming pregnant again.  Your unborn baby has some abnormalities.  You have vaginal bleeding during pregnancy.  You became pregnant through in vitro fertilization (IVF). Lifestyle and environmental factors  You use tobacco products.  You drink alcohol.  You use street drugs.  You have stress and no social support.  You experience domestic violence.  You are exposed to certain chemicals or environmental pollutants. Other factors  You are younger than age 58 or older than age 37. What are the signs or symptoms? Symptoms of this condition include:  Cramps similar to those that can happen during a menstrual period. The cramps may happen with diarrhea.  Pain in the abdomen or lower back.  Regular contractions that may feel like tightening of the abdomen.  A feeling of increased pressure in the pelvis.  Increased watery or bloody mucus discharge from the vagina.  Water breaking (ruptured amniotic  sac). How is this diagnosed? This condition is diagnosed based on:  Your medical history and a physical exam.  A pelvic exam.  An ultrasound.  Monitoring your uterus for contractions.  Other tests, including: ? A swab of the cervix to check for a chemical called fetal fibronectin. ? Urine tests. How is this treated? Treatment for this condition depends on the length of your pregnancy, your condition, and the health of your baby. Treatment may include:  Taking medicines, such as: ? Hormone medicines. These may be given early in pregnancy to help support the pregnancy. ? Medicines to stop contractions. ? Medicines to help mature the baby's lungs. These may be prescribed if the risk of delivery is high. ? Medicines to prevent your baby from developing cerebral palsy.  Bed rest. If the labor happens before 34 weeks of pregnancy, you may need to stay in the hospital.  Delivery of the baby. Follow these instructions at home:  Do not use any products that contain nicotine or tobacco, such as cigarettes, e-cigarettes, and chewing tobacco. If you need help quitting, ask your health care provider.  Do not drink alcohol.  Take over-the-counter and prescription medicines only as told by your health care provider.  Rest as told by your health care provider.  Return to your normal activities as told by your health care provider. Ask your health care provider what activities are safe for you.  Keep all follow-up visits as told by your health care provider. This is important.   How is this prevented? To increase your chance of having a full-term pregnancy:  Do not use street drugs or medicines that have not been prescribed to you during your pregnancy.  Talk with your health care provider before taking any herbal supplements, even if you have been taking them regularly.  Make sure you gain a healthy amount of weight during your pregnancy.  Watch for infection. If you think that you  might have an infection, get it checked right away. Symptoms of infection may include: ? Fever. ? Abnormal vaginal discharge or discharge that smells bad. ? Pain or burning with urination. ? Needing to urinate urgently. ? Frequently urinating or passing small amounts of urine frequently. ? Blood in your urine. ? Urine that smells bad or unusual.  Tell your health care provider if you have had preterm labor before. Contact a health care provider if:  You think you are going into preterm labor.  You have signs or  symptoms of preterm labor.  You have symptoms of infection. Get help right away if:  You are having regular, painful contractions every 5 minutes or less.  Your water breaks. Summary  Preterm labor is labor that starts before you reach 37 weeks of pregnancy.  Delivering your baby early increases your baby's risk of developing lifelong problems.  The exact cause of preterm labor is unknown. However, having an abnormal uterus, an STI (sexually transmitted infection), or vaginal bleeding during pregnancy increases your risk for preterm labor.  Keep all follow-up visits as told by your health care provider. This is important.  Contact a health care provider if you have signs or symptoms of preterm labor. This information is not intended to replace advice given to you by your health care provider. Make sure you discuss any questions you have with your health care provider. Document Revised: 03/25/2019 Document Reviewed: 03/25/2019 Elsevier Patient Education  2021 [employer].

## 2020-04-13 ENCOUNTER — Ambulatory Visit: Payer: Medicaid Other | Admitting: *Deleted

## 2020-04-13 ENCOUNTER — Ambulatory Visit: Payer: Medicaid Other | Attending: Obstetrics and Gynecology

## 2020-04-13 ENCOUNTER — Encounter: Payer: Self-pay | Admitting: *Deleted

## 2020-04-13 ENCOUNTER — Other Ambulatory Visit: Payer: Self-pay

## 2020-04-13 DIAGNOSIS — Z8759 Personal history of other complications of pregnancy, childbirth and the puerperium: Secondary | ICD-10-CM | POA: Insufficient documentation

## 2020-04-13 DIAGNOSIS — Z3A30 30 weeks gestation of pregnancy: Secondary | ICD-10-CM | POA: Diagnosis not present

## 2020-04-13 DIAGNOSIS — O321XX0 Maternal care for breech presentation, not applicable or unspecified: Secondary | ICD-10-CM

## 2020-04-13 DIAGNOSIS — O09293 Supervision of pregnancy with other poor reproductive or obstetric history, third trimester: Secondary | ICD-10-CM

## 2020-04-13 DIAGNOSIS — O365930 Maternal care for other known or suspected poor fetal growth, third trimester, not applicable or unspecified: Secondary | ICD-10-CM | POA: Diagnosis not present

## 2020-04-13 DIAGNOSIS — O09891 Supervision of other high risk pregnancies, first trimester: Secondary | ICD-10-CM | POA: Diagnosis present

## 2020-04-13 DIAGNOSIS — O365920 Maternal care for other known or suspected poor fetal growth, second trimester, not applicable or unspecified: Secondary | ICD-10-CM | POA: Diagnosis not present

## 2020-04-13 DIAGNOSIS — O0990 Supervision of high risk pregnancy, unspecified, unspecified trimester: Secondary | ICD-10-CM | POA: Diagnosis present

## 2020-04-14 ENCOUNTER — Telehealth: Payer: Medicaid Other | Admitting: Family Medicine

## 2020-04-14 DIAGNOSIS — Z8759 Personal history of other complications of pregnancy, childbirth and the puerperium: Secondary | ICD-10-CM

## 2020-04-14 DIAGNOSIS — O09891 Supervision of other high risk pregnancies, first trimester: Secondary | ICD-10-CM

## 2020-04-14 DIAGNOSIS — O0990 Supervision of high risk pregnancy, unspecified, unspecified trimester: Secondary | ICD-10-CM

## 2020-04-14 NOTE — Progress Notes (Signed)
No show. Per review of chart patient may not have IUGR based on 6wk [place]. Will discuss at next visit. Front office to reschedule.

## 2020-04-14 NOTE — Progress Notes (Signed)
Called pt, no answer and no show on video.  left message on VM about virtual appt today. 2/8 @ 0955. Will send Mychart message.   Pt did not answer again on video visit or telephone. Left message for pt to reschedule with front office,  Dr Crissie Reese aware   Judeth Cornfield, RN BSN 04/13/20

## 2020-04-20 ENCOUNTER — Other Ambulatory Visit: Payer: Self-pay

## 2020-04-20 ENCOUNTER — Encounter: Payer: Self-pay | Admitting: *Deleted

## 2020-04-20 ENCOUNTER — Ambulatory Visit: Payer: Medicaid Other | Admitting: *Deleted

## 2020-04-20 ENCOUNTER — Ambulatory Visit: Payer: Medicaid Other | Attending: Obstetrics

## 2020-04-20 DIAGNOSIS — O09891 Supervision of other high risk pregnancies, first trimester: Secondary | ICD-10-CM | POA: Diagnosis present

## 2020-04-20 DIAGNOSIS — O0990 Supervision of high risk pregnancy, unspecified, unspecified trimester: Secondary | ICD-10-CM | POA: Diagnosis present

## 2020-04-20 DIAGNOSIS — O365930 Maternal care for other known or suspected poor fetal growth, third trimester, not applicable or unspecified: Secondary | ICD-10-CM | POA: Diagnosis not present

## 2020-04-20 DIAGNOSIS — Z8759 Personal history of other complications of pregnancy, childbirth and the puerperium: Secondary | ICD-10-CM | POA: Diagnosis present

## 2020-04-26 ENCOUNTER — Encounter: Payer: Medicaid Other | Admitting: Medical

## 2020-04-27 ENCOUNTER — Telehealth: Payer: Self-pay | Admitting: Lactation Services

## 2020-04-27 ENCOUNTER — Ambulatory Visit: Payer: Medicaid Other

## 2020-04-27 ENCOUNTER — Telehealth: Payer: Self-pay | Admitting: Family Medicine

## 2020-04-27 ENCOUNTER — Telehealth: Payer: Self-pay

## 2020-04-27 NOTE — Telephone Encounter (Signed)
Pt called front desk concerned of having Diarrhea for the past week, advised Pt that she can take either Imodium AD or Kaopectate as directed on package. Drink plenty of water so she will not get dehydrated, & to let us no if nothing changes. Pt verbalized understanding.

## 2020-04-27 NOTE — Telephone Encounter (Signed)
Patient called and LM on nurse voicemail. She reports she has been experiencing diarrhea for the last week and is often going without realizing that she has to. She reports she is crying all the time and would like a call back to discuss.

## 2020-04-27 NOTE — Telephone Encounter (Signed)
Pt states she has had diarrhea for past week and its uncontrolled diarrhea. Pt states she can't control it and has had "several accident of bowel movement on herself and does not know what to d"'. Pt states she is also having contractions.Pt also states she "feels off". Pt states she is 33 weeks preg this Saturday.

## 2020-04-28 NOTE — Telephone Encounter (Signed)
Returned patients call. She spoke with Pam yesterday. Last night she was having strong painful contractions in her back and stomach all night. She did not ask her husband to take her to the hospital as he had to work today.   The baby is not moving as well as she was, she reports baby was moving well during the contractions and has only felt her move 5 times today in the last 5 hours. She is still having frequent diarrhea.   She is still having strong contractions today that are occurring pretty often and stop her in her tracks. She denies vaginal discharge or bleeding.   Advised patient to go to [facility] for evaluation ASAP. Patient reports she needed to make stops and lives an hour away. Report called to [facility] in [facility].

## 2020-04-30 ENCOUNTER — Encounter: Payer: Self-pay | Admitting: *Deleted

## 2020-04-30 ENCOUNTER — Other Ambulatory Visit: Payer: Self-pay

## 2020-04-30 ENCOUNTER — Other Ambulatory Visit: Payer: Self-pay | Admitting: *Deleted

## 2020-04-30 ENCOUNTER — Ambulatory Visit: Payer: Medicaid Other | Admitting: *Deleted

## 2020-04-30 ENCOUNTER — Ambulatory Visit: Payer: Medicaid Other | Attending: Obstetrics

## 2020-04-30 DIAGNOSIS — O321XX0 Maternal care for breech presentation, not applicable or unspecified: Secondary | ICD-10-CM

## 2020-04-30 DIAGNOSIS — O09891 Supervision of other high risk pregnancies, first trimester: Secondary | ICD-10-CM | POA: Diagnosis present

## 2020-04-30 DIAGNOSIS — O365930 Maternal care for other known or suspected poor fetal growth, third trimester, not applicable or unspecified: Secondary | ICD-10-CM | POA: Diagnosis not present

## 2020-04-30 DIAGNOSIS — Z3A32 32 weeks gestation of pregnancy: Secondary | ICD-10-CM

## 2020-04-30 DIAGNOSIS — O09293 Supervision of pregnancy with other poor reproductive or obstetric history, third trimester: Secondary | ICD-10-CM | POA: Diagnosis not present

## 2020-04-30 DIAGNOSIS — Z8759 Personal history of other complications of pregnancy, childbirth and the puerperium: Secondary | ICD-10-CM | POA: Diagnosis present

## 2020-04-30 DIAGNOSIS — O0990 Supervision of high risk pregnancy, unspecified, unspecified trimester: Secondary | ICD-10-CM

## 2020-04-30 NOTE — Progress Notes (Signed)
C/o " N/V/Diarrhea x 2 weeks with lower abd cramps. Denies H/A, fever, loss of taste or smell." OB MD aware per pt.

## 2020-05-04 ENCOUNTER — Ambulatory Visit: Payer: Medicaid Other | Admitting: *Deleted

## 2020-05-04 ENCOUNTER — Encounter: Payer: Self-pay | Admitting: *Deleted

## 2020-05-04 ENCOUNTER — Ambulatory Visit: Payer: Medicaid Other | Attending: Obstetrics and Gynecology

## 2020-05-04 ENCOUNTER — Other Ambulatory Visit: Payer: Self-pay

## 2020-05-04 DIAGNOSIS — Z8759 Personal history of other complications of pregnancy, childbirth and the puerperium: Secondary | ICD-10-CM | POA: Insufficient documentation

## 2020-05-04 DIAGNOSIS — O0990 Supervision of high risk pregnancy, unspecified, unspecified trimester: Secondary | ICD-10-CM | POA: Insufficient documentation

## 2020-05-04 DIAGNOSIS — O365930 Maternal care for other known or suspected poor fetal growth, third trimester, not applicable or unspecified: Secondary | ICD-10-CM | POA: Diagnosis not present

## 2020-05-04 DIAGNOSIS — O09891 Supervision of other high risk pregnancies, first trimester: Secondary | ICD-10-CM | POA: Insufficient documentation

## 2020-05-05 ENCOUNTER — Encounter: Payer: Medicaid Other | Admitting: Obstetrics and Gynecology

## 2020-05-10 ENCOUNTER — Encounter: Payer: Medicaid Other | Admitting: Obstetrics and Gynecology

## 2020-05-14 ENCOUNTER — Ambulatory Visit: Payer: Medicaid Other | Attending: Obstetrics and Gynecology

## 2020-05-14 ENCOUNTER — Ambulatory Visit: Payer: Medicaid Other

## 2020-05-18 ENCOUNTER — Ambulatory Visit: Payer: Medicaid Other | Attending: Obstetrics and Gynecology | Admitting: *Deleted

## 2020-05-18 ENCOUNTER — Telehealth: Payer: Self-pay

## 2020-05-18 ENCOUNTER — Other Ambulatory Visit: Payer: Self-pay

## 2020-05-18 ENCOUNTER — Other Ambulatory Visit (HOSPITAL_COMMUNITY)
Admission: RE | Admit: 2020-05-18 | Discharge: 2020-05-18 | Disposition: A | Discharge status: Home / Self Care | Payer: Medicaid Other | Source: Ambulatory Visit | Attending: Obstetrics and Gynecology | Admitting: Obstetrics and Gynecology

## 2020-05-18 ENCOUNTER — Ambulatory Visit (INDEPENDENT_AMBULATORY_CARE_PROVIDER_SITE_OTHER): Payer: Medicaid Other | Admitting: Family Medicine

## 2020-05-18 ENCOUNTER — Encounter: Payer: Self-pay | Admitting: Family Medicine

## 2020-05-18 ENCOUNTER — Encounter: Payer: Self-pay | Admitting: *Deleted

## 2020-05-18 ENCOUNTER — Other Ambulatory Visit: Payer: Self-pay | Admitting: *Deleted

## 2020-05-18 ENCOUNTER — Ambulatory Visit (HOSPITAL_BASED_OUTPATIENT_CLINIC_OR_DEPARTMENT_OTHER): Payer: Medicaid Other

## 2020-05-18 VITALS — BP 110/67 | HR 103 | Wt 158.0 lb

## 2020-05-18 DIAGNOSIS — O365990 Maternal care for other known or suspected poor fetal growth, unspecified trimester, not applicable or unspecified: Secondary | ICD-10-CM

## 2020-05-18 DIAGNOSIS — O365930 Maternal care for other known or suspected poor fetal growth, third trimester, not applicable or unspecified: Secondary | ICD-10-CM | POA: Diagnosis not present

## 2020-05-18 DIAGNOSIS — O09293 Supervision of pregnancy with other poor reproductive or obstetric history, third trimester: Secondary | ICD-10-CM | POA: Insufficient documentation

## 2020-05-18 DIAGNOSIS — O09891 Supervision of other high risk pregnancies, first trimester: Secondary | ICD-10-CM

## 2020-05-18 DIAGNOSIS — O321XX0 Maternal care for breech presentation, not applicable or unspecified: Secondary | ICD-10-CM | POA: Insufficient documentation

## 2020-05-18 DIAGNOSIS — Z3A35 35 weeks gestation of pregnancy: Secondary | ICD-10-CM

## 2020-05-18 DIAGNOSIS — Z8759 Personal history of other complications of pregnancy, childbirth and the puerperium: Secondary | ICD-10-CM

## 2020-05-18 DIAGNOSIS — O0990 Supervision of high risk pregnancy, unspecified, unspecified trimester: Secondary | ICD-10-CM | POA: Diagnosis not present

## 2020-05-18 DIAGNOSIS — O99342 Other mental disorders complicating pregnancy, second trimester: Secondary | ICD-10-CM

## 2020-05-18 DIAGNOSIS — Z8632 Personal history of gestational diabetes: Secondary | ICD-10-CM

## 2020-05-18 DIAGNOSIS — F32A Depression, unspecified: Secondary | ICD-10-CM

## 2020-05-18 NOTE — Progress Notes (Signed)
   Subjective:  Yvette Mosley is a 30 y.o. [phone] at 106w2d being seen today for ongoing prenatal care.  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  Patient reports no complaints.  Contractions: Irritability. Vag. Bleeding: None.  Movement: Present. Denies leaking of fluid.   The following portions of the patient's history were reviewed and updated as appropriate: allergies, current medications, past family history, past medical history, past social history, past surgical history and problem list. Problem list updated.  Objective:   Vitals:   05/18/20 1123  BP: 110/67  Pulse: (!) 103  Weight: 158 lb (71.7 kg)    Fetal Status: Fetal Heart Rate (bpm): 148   Movement: Present  Presentation: Complete Breech  General:  Alert, oriented and cooperative. Patient is in no acute distress.  Skin: Skin is warm and dry. No rash noted.   Cardiovascular: Normal heart rate noted  Respiratory: Normal respiratory effort, no problems with respiration noted  Abdomen: Soft, gravid, appropriate for gestational age. Pain/Pressure: Present     Pelvic: Vag. Bleeding: None     Cervical exam performed Dilation: 1.5 Effacement (%): Thick Station: -2  Extremities: Normal range of motion.  Edema: Trace  Mental Status: Normal mood and affect. Normal behavior. Normal judgment and thought content.   Urinalysis:      Assessment and Plan:  Pregnancy: [id number] at [redacted]w[redacted]d  1. History of pre-eclampsia Bps normal to date this pregnancy  2. Short interval between  pregnancies affecting pregnancy in first trimester, antepartum   3. Supervision of high risk pregnancy, antepartum BP and FHR normal Swabs collected today Moving to [place], [place] but still plans to deliver here. Discussed this is a risky plan given she has had fast labors in the past and this is over an hour away. If committed to this plan recommended driving through towns that have a L&D or going by ambulance  4. IUGR, antenatal Resolved on most recent scan with EFW 18% on 05/01/2019 BPP 8/8 and normal dopplers on 05/05/2019 Scheduled for repeat growth/dopplers/BPP today  5. History of gestational diabetes Normal 2hr this pregnancy  6. History of oligohydramnios AFI normal to date  7. 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 Breech presentation of fetus Patient breech by last MFM [place] as well as by cervical exam today Discussed possible options of ECV vs planned breech delivery vs PCS for malpresentation Strongly desires to avoid a cesarean, I am in agreement given this will be her sixth delivery Has MFM scan later today, will plan accordingly  Preterm labor symptoms and general obstetric precautions including but not limited to vaginal bleeding, contractions, leaking of fluid and fetal movement were reviewed in detail with the patient. Please refer to After Visit Summary for other counseling recommendations.  Return in 2 weeks (on 06/01/2020) for [facility], ob visit.   Venora Maples, MD

## 2020-05-18 NOTE — Patient Instructions (Signed)
 Contraception Choices Contraception, also called birth control, refers to methods or devices that prevent pregnancy. Hormonal methods Contraceptive implant A contraceptive implant is a thin, plastic tube that contains a hormone that prevents pregnancy. It is different from an intrauterine device (IUD). It is inserted into the upper part of the arm by a health care provider. Implants can be effective for up to 3 years. Progestin-only injections Progestin-only injections are injections of progestin, a synthetic form of the hormone progesterone. They are given every 3 months by a health care provider. Birth control pills Birth control pills are pills that contain hormones that prevent pregnancy. They must be taken once a day, preferably at the same time each day. A prescription is needed to use this method of contraception. Birth control patch The birth control patch contains hormones that prevent pregnancy. It is placed on the skin and must be changed once a week for three weeks and removed on the fourth week. A prescription is needed to use this method of contraception. Vaginal ring A vaginal ring contains hormones that prevent pregnancy. It is placed in the vagina for three weeks and removed on the fourth week. After that, the process is repeated with a new ring. A prescription is needed to use this method of contraception. Emergency contraceptive Emergency contraceptives prevent pregnancy after unprotected sex. They come in pill form and can be taken up to 5 days after sex. They work best the sooner they are taken after having sex. Most emergency contraceptives are available without a prescription. This method should not be used as your only form of birth control.   Barrier methods Female condom A female condom is a thin sheath that is worn over the penis during sex. Condoms keep sperm from going inside a woman's body. They can be used with a sperm-killing substance (spermicide) to increase their  effectiveness. They should be thrown away after one use. Female condom A female condom is a soft, loose-fitting sheath that is put into the vagina before sex. The condom keeps sperm from going inside a woman's body. They should be thrown away after one use. Diaphragm A diaphragm is a soft, dome-shaped barrier. It is inserted into the vagina before sex, along with a spermicide. The diaphragm blocks sperm from entering the uterus, and the spermicide kills sperm. A diaphragm should be left in the vagina for 6-8 hours after sex and removed within 24 hours. A diaphragm is prescribed and fitted by a health care provider. A diaphragm should be replaced every 1-2 years, after giving birth, after gaining more than 15 lb (6.8 kg), and after pelvic surgery. Cervical cap A cervical cap is a round, soft latex or plastic cup that fits over the cervix. It is inserted into the vagina before sex, along with spermicide. It blocks sperm from entering the uterus. The cap should be left in place for 6-8 hours after sex and removed within 48 hours. A cervical cap must be prescribed and fitted by a health care provider. It should be replaced every 2 years. Sponge A sponge is a soft, circular piece of polyurethane foam with spermicide in it. The sponge helps block sperm from entering the uterus, and the spermicide kills sperm. To use it, you make it wet and then insert it into the vagina. It should be inserted before sex, left in for at least 6 hours after sex, and removed and thrown away within 30 hours. Spermicides Spermicides are chemicals that kill or block sperm from entering the   cervix and uterus. They can come as a cream, jelly, suppository, foam, or tablet. A spermicide should be inserted into the vagina with an applicator at least 10-15 minutes before sex to allow time for it to work. The process must be repeated every time you have sex. Spermicides do not require a prescription.   Intrauterine  contraception Intrauterine device (IUD) An IUD is a T-shaped device that is put in a woman's uterus. There are two types:  Hormone IUD.This type contains progestin, a synthetic form of the hormone progesterone. This type can stay in place for 3-5 years.  Copper IUD.This type is wrapped in copper wire. It can stay in place for 10 years. Permanent methods of contraception Female tubal ligation In this method, a woman's fallopian tubes are sealed, tied, or blocked during surgery to prevent eggs from traveling to the uterus. Hysteroscopic sterilization In this method, a small, flexible insert is placed into each fallopian tube. The inserts cause scar tissue to form in the fallopian tubes and block them, so sperm cannot reach an egg. The procedure takes about 3 months to be effective. Another form of birth control must be used during those 3 months. Female sterilization This is a procedure to tie off the tubes that carry sperm (vasectomy). After the procedure, the man can still ejaculate fluid (semen). Another form of birth control must be used for 3 months after the procedure. Natural planning methods Natural family planning In this method, a couple does not have sex on days when the woman could become pregnant. Calendar method In this method, the woman keeps track of the length of each menstrual cycle, identifies the days when pregnancy can happen, and does not have sex on those days. Ovulation method In this method, a couple avoids sex during ovulation. Symptothermal method This method involves not having sex during ovulation. The woman typically checks for ovulation by watching changes in her temperature and in the consistency of cervical mucus. Post-ovulation method In this method, a couple waits to have sex until after ovulation. Where to find more information  Centers for Disease Control and Prevention: www.cdc.gov Summary  Contraception, also called birth control, refers to methods or  devices that prevent pregnancy.  Hormonal methods of contraception include implants, injections, pills, patches, vaginal rings, and emergency contraceptives.  Barrier methods of contraception can include female condoms, female condoms, diaphragms, cervical caps, sponges, and spermicides.  There are two types of IUDs (intrauterine devices). An IUD can be put in a woman's uterus to prevent pregnancy for 3-5 years.  Permanent sterilization can be done through a procedure for males and females. Natural family planning methods involve nothaving sex on days when the woman could become pregnant. This information is not intended to replace advice given to you by your health care provider. Make sure you discuss any questions you have with your health care provider. Document Revised: 07/28/2019 Document Reviewed: 07/28/2019 Elsevier Patient Education  2021 [employer] Inc.   Breastfeeding  Choosing to breastfeed is one of the best decisions you can make for yourself and your baby. A change in hormones during pregnancy causes your breasts to make breast milk in your milk-producing glands. Hormones prevent breast milk from being released before your baby is born. They also prompt milk flow after birth. Once breastfeeding has begun, thoughts of your baby, as well as his or her sucking or crying, can stimulate the release of milk from your milk-producing glands. Benefits of breastfeeding Research shows that breastfeeding offers many health benefits   for infants and mothers. It also offers a cost-free and convenient way to feed your baby. For your baby  Your first milk (colostrum) helps your baby's digestive system to function better.  Special cells in your milk (antibodies) help your baby to fight off infections.  Breastfed babies are less likely to develop asthma, allergies, obesity, or type 2 diabetes. They are also at lower risk for sudden infant death syndrome (SIDS).  Nutrients in breast milk are better  able to meet your baby's needs compared to infant formula.  Breast milk improves your baby's brain development. For you  Breastfeeding helps to create a very special bond between you and your baby.  Breastfeeding is convenient. Breast milk costs nothing and is always available at the correct temperature.  Breastfeeding helps to burn calories. It helps you to lose the weight that you gained during pregnancy.  Breastfeeding makes your uterus return faster to its size before pregnancy. It also slows bleeding (lochia) after you give birth.  Breastfeeding helps to lower your risk of developing type 2 diabetes, osteoporosis, rheumatoid arthritis, cardiovascular disease, and breast, ovarian, uterine, and endometrial cancer later in life. Breastfeeding basics Starting breastfeeding  Find a comfortable place to sit or lie down, with your neck and back well-supported.  Place a pillow or a rolled-up blanket under your baby to bring him or her to the level of your breast (if you are seated). Nursing pillows are specially designed to help support your arms and your baby while you breastfeed.  Make sure that your baby's tummy (abdomen) is facing your abdomen.  Gently massage your breast. With your fingertips, massage from the outer edges of your breast inward toward the nipple. This encourages milk flow. If your milk flows slowly, you may need to continue this action during the feeding.  Support your breast with 4 fingers underneath and your thumb above your nipple (make the letter "C" with your hand). Make sure your fingers are well away from your nipple and your baby's mouth.  Stroke your baby's lips gently with your finger or nipple.  When your baby's mouth is open wide enough, quickly bring your baby to your breast, placing your entire nipple and as much of the areola as possible into your baby's mouth. The areola is the colored area around your nipple. ? More areola should be visible above your  baby's upper lip than below the lower lip. ? Your baby's lips should be opened and extended outward (flanged) to ensure an adequate, comfortable latch. ? Your baby's tongue should be between his or her lower gum and your breast.  Make sure that your baby's mouth is correctly positioned around your nipple (latched). Your baby's lips should create a seal on your breast and be turned out (everted).  It is common for your baby to suck about 2-3 minutes in order to start the flow of breast milk. Latching Teaching your baby how to latch onto your breast properly is very important. An improper latch can cause nipple pain, decreased milk supply, and poor weight gain in your baby. Also, if your baby is not latched onto your nipple properly, he or she may swallow some air during feeding. This can make your baby fussy. Burping your baby when you switch breasts during the feeding can help to get rid of the air. However, teaching your baby to latch on properly is still the best way to prevent fussiness from swallowing air while breastfeeding. Signs that your baby has successfully latched onto   your nipple  Silent tugging or silent sucking, without causing you pain. Infant's lips should be extended outward (flanged).  Swallowing heard between every 3-4 sucks once your milk has started to flow (after your let-down milk reflex occurs).  Muscle movement above and in front of his or her ears while sucking. Signs that your baby has not successfully latched onto your nipple  Sucking sounds or smacking sounds from your baby while breastfeeding.  Nipple pain. If you think your baby has not latched on correctly, slip your finger into the corner of your baby's mouth to break the suction and place it between your baby's gums. Attempt to start breastfeeding again. Signs of successful breastfeeding Signs from your baby  Your baby will gradually decrease the number of sucks or will completely stop sucking.  Your baby  will fall asleep.  Your baby's body will relax.  Your baby will retain a small amount of milk in his or her mouth.  Your baby will let go of your breast by himself or herself. Signs from you  Breasts that have increased in firmness, weight, and size 1-3 hours after feeding.  Breasts that are softer immediately after breastfeeding.  Increased milk volume, as well as a change in milk consistency and color by the fifth day of breastfeeding.  Nipples that are not sore, cracked, or bleeding. Signs that your baby is getting enough milk  Wetting at least 1-2 diapers during the first 24 hours after birth.  Wetting at least 5-6 diapers every 24 hours for the first week after birth. The urine should be clear or pale yellow by the age of 5 days.  Wetting 6-8 diapers every 24 hours as your baby continues to grow and develop.  At least 3 stools in a 24-hour period by the age of 5 days. The stool should be soft and yellow.  At least 3 stools in a 24-hour period by the age of 7 days. The stool should be seedy and yellow.  No loss of weight greater than 10% of birth weight during the first 3 days of life.  Average weight gain of 4-7 oz (113-198 g) per week after the age of 4 days.  Consistent daily weight gain by the age of 5 days, without weight loss after the age of 2 weeks. After a feeding, your baby may spit up a small amount of milk. This is normal. Breastfeeding frequency and duration Frequent feeding will help you make more milk and can prevent sore nipples and extremely full breasts (breast engorgement). Breastfeed when you feel the need to reduce the fullness of your breasts or when your baby shows signs of hunger. This is called "breastfeeding on demand." Signs that your baby is hungry include:  Increased alertness, activity, or restlessness.  Movement of the head from side to side.  Opening of the mouth when the corner of the mouth or cheek is stroked (rooting).  Increased  sucking sounds, smacking lips, cooing, sighing, or squeaking.  Hand-to-mouth movements and sucking on fingers or hands.  Fussing or crying. Avoid introducing a pacifier to your baby in the first 4-6 weeks after your baby is born. After this time, you may choose to use a pacifier. Research has shown that pacifier use during the first year of a baby's life decreases the risk of sudden infant death syndrome (SIDS). Allow your baby to feed on each breast as long as he or she wants. When your baby unlatches or falls asleep while feeding from the   first breast, offer the second breast. Because newborns are often sleepy in the first few weeks of life, you may need to awaken your baby to get him or her to feed. Breastfeeding times will vary from baby to baby. However, the following rules can serve as a guide to help you make sure that your baby is properly fed:  Newborns (babies 4 weeks of age or younger) may breastfeed every 1-3 hours.  Newborns should not go without breastfeeding for longer than 3 hours during the day or 5 hours during the night.  You should breastfeed your baby a minimum of 8 times in a 24-hour period. Breast milk pumping Pumping and storing breast milk allows you to make sure that your baby is exclusively fed your breast milk, even at times when you are unable to breastfeed. This is especially important if you go back to work while you are still breastfeeding, or if you are not able to be present during feedings. Your lactation consultant can help you find a method of pumping that works best for you and give you guidelines about how long it is safe to store breast milk.      Caring for your breasts while you breastfeed Nipples can become dry, cracked, and sore while breastfeeding. The following recommendations can help keep your breasts moisturized and healthy:  Avoid using soap on your nipples.  Wear a supportive bra designed especially for nursing. Avoid wearing underwire-style  bras or extremely tight bras (sports bras).  Air-dry your nipples for 3-4 minutes after each feeding.  Use only cotton bra pads to absorb leaked breast milk. Leaking of breast milk between feedings is normal.  Use lanolin on your nipples after breastfeeding. Lanolin helps to maintain your skin's normal moisture barrier. Pure lanolin is not harmful (not toxic) to your baby. You may also hand express a few drops of breast milk and gently massage that milk into your nipples and allow the milk to air-dry. In the first few weeks after giving birth, some women experience breast engorgement. Engorgement can make your breasts feel heavy, warm, and tender to the touch. Engorgement peaks within 3-5 days after you give birth. The following recommendations can help to ease engorgement:  Completely empty your breasts while breastfeeding or pumping. You may want to start by applying warm, moist heat (in the shower or with warm, water-soaked hand towels) just before feeding or pumping. This increases circulation and helps the milk flow. If your baby does not completely empty your breasts while breastfeeding, pump any extra milk after he or she is finished.  Apply ice packs to your breasts immediately after breastfeeding or pumping, unless this is too uncomfortable for you. To do this: ? Put ice in a plastic bag. ? Place a towel between your skin and the bag. ? Leave the ice on for 20 minutes, 2-3 times a day.  Make sure that your baby is latched on and positioned properly while breastfeeding. If engorgement persists after 48 hours of following these recommendations, contact your health care provider or a lactation consultant. Overall health care recommendations while breastfeeding  Eat 3 healthy meals and 3 snacks every day. Well-nourished mothers who are breastfeeding need an additional 450-500 calories a day. You can meet this requirement by increasing the amount of a balanced diet that you eat.  Drink  enough water to keep your urine pale yellow or clear.  Rest often, relax, and continue to take your prenatal vitamins to prevent fatigue, stress, and low   vitamin and mineral levels in your body (nutrient deficiencies).  Do not use any products that contain nicotine or tobacco, such as cigarettes and e-cigarettes. Your baby may be harmed by chemicals from cigarettes that pass into breast milk and exposure to secondhand smoke. If you need help quitting, ask your health care provider.  Avoid alcohol.  Do not use illegal drugs or marijuana.  Talk with your health care provider before taking any medicines. These include over-the-counter and prescription medicines as well as vitamins and herbal supplements. Some medicines that may be harmful to your baby can pass through breast milk.  It is possible to become pregnant while breastfeeding. If birth control is desired, ask your health care provider about options that will be safe while breastfeeding your baby. Where to find more information: [employer]: www.llli.org Contact a health care provider if:  You feel like you want to stop breastfeeding or have become frustrated with breastfeeding.  Your nipples are cracked or bleeding.  Your breasts are red, tender, or warm.  You have: ? Painful breasts or nipples. ? A swollen area on either breast. ? A fever or chills. ? Nausea or vomiting. ? Drainage other than breast milk from your nipples.  Your breasts do not become full before feedings by the fifth day after you give birth.  You feel sad and depressed.  Your baby is: ? Too sleepy to eat well. ? Having trouble sleeping. ? More than 1 week old and wetting fewer than 6 diapers in a 24-hour period. ? Not gaining weight by 5 days of age.  Your baby has fewer than 3 stools in a 24-hour period.  Your baby's skin or the white parts of his or her eyes become yellow. Get help right away if:  Your baby is overly tired  (lethargic) and does not want to wake up and feed.  Your baby develops an unexplained fever. Summary  Breastfeeding offers many health benefits for infant and mothers.  Try to breastfeed your infant when he or she shows early signs of hunger.  Gently tickle or stroke your baby's lips with your finger or nipple to allow the baby to open his or her mouth. Bring the baby to your breast. Make sure that much of the areola is in your baby's mouth. Offer one side and burp the baby before you offer the other side.  Talk with your health care provider or lactation consultant if you have questions or you face problems as you breastfeed. This information is not intended to replace advice given to you by your health care provider. Make sure you discuss any questions you have with your health care provider. Document Revised: 05/17/2017 Document Reviewed: 03/24/2016 Elsevier Patient Education  2021 Elsevier Inc.  

## 2020-05-18 NOTE — Telephone Encounter (Signed)
Spoke with Bronson Curb who is this patient's [place] [employer]. She was following up on this patient's appointments and wanted to let us know she was trying to contact her.

## 2020-05-19 ENCOUNTER — Telehealth: Payer: Self-pay | Admitting: Family Medicine

## 2020-05-19 LAB — GC/CHLAMYDIA PROBE AMP (~~LOC~~) NOT AT ARMC
Chlamydia: NEGATIVE
Comment: NEGATIVE
Comment: NORMAL
Neisseria Gonorrhea: NEGATIVE

## 2020-05-19 NOTE — Telephone Encounter (Signed)
Called patient to discuss plan for management of breech presentation.  Scheduled for ECV with Dr. Debroah Loop on 05/30/2020 at 8 AM. Patient understands she will need to arrive slightly before then and NPO after midnight.  While on the phone reported mild cold symptoms and decreased fetal movement. I instructed her in kick counts and called back 30 minutes later and she had already had 5 movements. Discussed that as long as she has 6 kicks per hour this is reassuring, but if ongoing or decreased fetal movement needs to come in for an evaluation to the hospital.

## 2020-05-21 ENCOUNTER — Ambulatory Visit: Payer: Medicaid Other

## 2020-05-22 LAB — CULTURE, BETA STREP (GROUP B ONLY): Strep Gp B Culture: NEGATIVE

## 2020-05-25 ENCOUNTER — Encounter: Payer: Self-pay | Admitting: *Deleted

## 2020-05-25 ENCOUNTER — Other Ambulatory Visit: Payer: Self-pay

## 2020-05-25 ENCOUNTER — Ambulatory Visit: Payer: Medicaid Other | Admitting: *Deleted

## 2020-05-25 ENCOUNTER — Ambulatory Visit: Payer: Medicaid Other | Attending: Obstetrics and Gynecology

## 2020-05-25 DIAGNOSIS — Z8759 Personal history of other complications of pregnancy, childbirth and the puerperium: Secondary | ICD-10-CM

## 2020-05-25 DIAGNOSIS — O09891 Supervision of other high risk pregnancies, first trimester: Secondary | ICD-10-CM | POA: Diagnosis present

## 2020-05-25 DIAGNOSIS — O365930 Maternal care for other known or suspected poor fetal growth, third trimester, not applicable or unspecified: Secondary | ICD-10-CM | POA: Diagnosis not present

## 2020-05-25 DIAGNOSIS — O0990 Supervision of high risk pregnancy, unspecified, unspecified trimester: Secondary | ICD-10-CM

## 2020-05-25 NOTE — Telephone Encounter (Signed)
Called pt. VM left.

## 2020-05-26 ENCOUNTER — Encounter (HOSPITAL_COMMUNITY): Payer: Self-pay | Admitting: *Deleted

## 2020-05-26 ENCOUNTER — Telehealth (HOSPITAL_COMMUNITY): Payer: Self-pay | Admitting: *Deleted

## 2020-05-26 ENCOUNTER — Other Ambulatory Visit: Payer: Self-pay | Admitting: Advanced Practice Midwife

## 2020-05-26 NOTE — Telephone Encounter (Signed)
Preadmission screen  

## 2020-05-26 NOTE — Telephone Encounter (Signed)
Preadmission screen NPO after MN no medications day of version.  Verbalized understanding

## 2020-05-28 ENCOUNTER — Other Ambulatory Visit (HOSPITAL_COMMUNITY)
Admission: RE | Admit: 2020-05-28 | Discharge: 2020-05-28 | Disposition: A | Discharge status: Home / Self Care | Payer: Medicaid Other | Source: Ambulatory Visit | Attending: Obstetrics & Gynecology | Admitting: Obstetrics & Gynecology

## 2020-05-28 DIAGNOSIS — Z01812 Encounter for preprocedural laboratory examination: Secondary | ICD-10-CM | POA: Diagnosis not present

## 2020-05-28 DIAGNOSIS — Z20822 Contact with and (suspected) exposure to covid-19: Secondary | ICD-10-CM | POA: Diagnosis not present

## 2020-05-28 LAB — SARS CORONAVIRUS 2 (TAT 6-24 HRS): SARS Coronavirus 2: NEGATIVE

## 2020-05-29 ENCOUNTER — Other Ambulatory Visit (HOSPITAL_COMMUNITY): Payer: Self-pay | Admitting: General Practice

## 2020-05-30 ENCOUNTER — Encounter (HOSPITAL_COMMUNITY): Payer: Self-pay | Admitting: Certified Registered Nurse Anesthetist

## 2020-05-30 ENCOUNTER — Other Ambulatory Visit: Payer: Self-pay

## 2020-05-30 ENCOUNTER — Observation Stay (HOSPITAL_COMMUNITY): Payer: Medicaid Other

## 2020-05-30 ENCOUNTER — Observation Stay (HOSPITAL_COMMUNITY)
Admission: AD | Admit: 2020-05-30 | Discharge: 2020-05-30 | Disposition: A | Discharge status: Home / Self Care | Payer: Medicaid Other | Attending: Obstetrics & Gynecology | Admitting: Obstetrics & Gynecology

## 2020-05-30 DIAGNOSIS — O24419 Gestational diabetes mellitus in pregnancy, unspecified control: Secondary | ICD-10-CM | POA: Insufficient documentation

## 2020-05-30 DIAGNOSIS — Z3A37 37 weeks gestation of pregnancy: Secondary | ICD-10-CM | POA: Insufficient documentation

## 2020-05-30 DIAGNOSIS — O321XX0 Maternal care for breech presentation, not applicable or unspecified: Principal | ICD-10-CM | POA: Diagnosis present

## 2020-05-30 LAB — CBC
HCT: 27.2 % — ABNORMAL LOW (ref 36.0–46.0)
Hemoglobin: 9.1 g/dL — ABNORMAL LOW (ref 12.0–15.0)
MCH: 34.3 pg — ABNORMAL HIGH (ref 26.0–34.0)
MCHC: 33.5 g/dL (ref 30.0–36.0)
MCV: 102.6 fL — ABNORMAL HIGH (ref 80.0–100.0)
Platelets: 163 10*3/uL (ref 150–400)
RBC: 2.65 MIL/uL — ABNORMAL LOW (ref 3.87–5.11)
RDW: 14.3 % (ref 11.5–15.5)
WBC: 8.2 10*3/uL (ref 4.0–10.5)
nRBC: 0 % (ref 0.0–0.2)

## 2020-05-30 LAB — TYPE AND SCREEN
ABO/RH(D): O POS
Antibody Screen: NEGATIVE

## 2020-05-30 MED ORDER — TERBUTALINE SULFATE 1 MG/ML IJ SOLN
INTRAMUSCULAR | Status: AC
  Filled 2020-05-30: qty 1

## 2020-05-30 MED ORDER — TERBUTALINE SULFATE 1 MG/ML IJ SOLN
0.2500 mg | Freq: Once | INTRAMUSCULAR | Status: AC
  Administered 2020-05-30: 0.25 mg via SUBCUTANEOUS

## 2020-05-30 MED ORDER — LACTATED RINGERS IV SOLN: INTRAVENOUS | Status: DC

## 2020-05-30 NOTE — Progress Notes (Signed)
After informed verbal consent, Terbutaline 0.25 mg SQ given, ECV was attempted under Ultrasound guidance.  Multiple attempts, patient found it difficult to tolerate and version was unsuccessful. Variable deceleration recovered rapidly  FHR was reactive before and after the procedure.   Pt. Tolerated the procedure well.Patient ID: Yvette Mosley, female   DOB: May 21, 1990, 30 y.o.   MRN: [id number]

## 2020-05-30 NOTE — Discharge Summary (Signed)
Patient ID: Yvette Mosley MRN: [id number] DOB/AGE: April 13, 1990 29 y.o.  Admit date: 05/30/2020 Discharge date: 05/30/2020  Admission Diagnoses:[redacted]w[redacted]d , breech presentation  Discharge Diagnoses: [redacted]w[redacted]d , breech presentation   Prenatal Procedures: ultrasound    Hospital Course:  This is a 30 y.o. [id number] with IUP at [redacted]w[redacted]d admitted for ECV for breech. ECV was attempted but was unsuccessful   She was deemed stable for discharge to home with outpatient follow up.  Discharge Exam: Temp:  [97.4 F (36.3 C)-98.1 F (36.7 C)] 98.1 F (36.7 C) (03/27 1055) Pulse Rate:  [80-103] 80 (03/27 1055) Resp:  [16-18] 16 (03/27 1055) BP: (112-122)/(66-83) 112/66 (03/27 1055) SpO2:  [100 %] 100 % (03/27 0802) Weight:  [73 kg] 73 kg (03/27 0802) Physical Examination: CONSTITUTIONAL: Well-developed, well-nourished female in no acute distress.  HENT:  Normocephalic, atraumatic, External right and left ear normal. Oropharynx is clear and moist EYES: Conjunctivae and EOM are normal. Pupils are equal, round, and reactive to light. No scleral icterus.  NECK: Normal range of motion, supple, no masses SKIN: Skin is warm and dry. No rash noted. Not diaphoretic. No erythema. No pallor. NEUROLGIC: Alert and oriented to person, place, and time. Normal reflexes, muscle tone coordination. No cranial nerve deficit noted. PSYCHIATRIC: Normal mood and affect. Normal behavior. Normal judgment and thought content. CARDIOVASCULAR: Normal heart rate noted, regular rhythm RESPIRATORY: Effort and breath sounds normal, no problems with respiration noted MUSCULOSKELETAL: Normal range of motion. No edema and no tenderness. 2+ distal pulses. ABDOMEN: Soft, nontender, nondistended, gravid. CERVIX:    Fetal monitoring: FHR: 140 bpm, Variability: moderate, Accelerations: Present, Decelerations: Absent  Uterine activity: 0 contractions per hour  Significant Diagnostic Studies:  Results for orders placed or performed  during the hospital encounter of 05/30/20 (from the past 168 hour(s))  CBC   Collection Time: 05/30/20  8:18 AM  Result Value Ref Range   WBC 8.2 4.0 - 10.5 K/uL   RBC 2.65 (L) 3.87 - 5.11 MIL/uL   Hemoglobin 9.1 (L) 12.0 - 15.0 g/dL   HCT 18.2 (L) 99.3 - 71.6 %   MCV 102.6 (H) 80.0 - 100.0 fL   MCH 34.3 (H) 26.0 - 34.0 pg   MCHC 33.5 30.0 - 36.0 g/dL   RDW 96.7 89.3 - 81.0 %   Platelets 163 150 - 400 K/uL   nRBC 0.0 0.0 - 0.2 %  Type and screen   Collection Time: 05/30/20  8:20 AM  Result Value Ref Range   ABO/RH(D) O POS    Antibody Screen NEG    Sample Expiration      06/02/2020,2359 Performed at [facility], [address] [place], [place] [address]   Results for orders placed or performed during the hospital encounter of 05/28/20 (from the past 168 hour(s))  SARS CORONAVIRUS 2 (TAT 6-24 HRS) Nasopharyngeal Nasopharyngeal Swab   Collection Time: 05/28/20 10:20 AM   Specimen: Nasopharyngeal Swab  Result Value Ref Range   SARS Coronavirus 2 NEGATIVE NEGATIVE    Discharge Condition: Stable  Disposition: Discharge disposition: 01-Home or Self Care        Discharge Instructions    Discharge patient   Complete by: As directed    Discharge disposition: 01-Home or Self Care   Discharge patient date: 05/30/2020     Allergies as of 05/30/2020      Reactions   Alpha-gal Other (See Comments)   Abdominal pain    Ceclor [cefaclor] Hives      Medication List  TAKE these medications   ondansetron 4 MG disintegrating tablet Commonly known as: Zofran ODT Take 1 tablet (4 mg total) by mouth every 8 (eight) hours as needed for nausea or vomiting.   promethazine 25 MG tablet Commonly known as: PHENERGAN Take 25 mg by mouth every 6 (six) hours as needed for nausea or vomiting.   scopolamine 1 MG/3DAYS Commonly known as: TRANSDERM-SCOP Place 1 patch onto the skin every 3 (three) days.       Follow-up Information    [facility] Follow up.   Specialty: Obstetrics and Gynecology Contact information: [address] [place] [address] [phone]              Signed: Scheryl Darter M.D. 05/30/2020, 10:58 AM

## 2020-05-30 NOTE — Discharge Instructions (Signed)
Fetal Positions  In the final weeks of your pregnancy, your baby usually moves into a head-down (vertex) position to get ready for birth. As a normal delivery proceeds through the stages of labor, the baby tucks in the chin and turns to face your back. In this position, the back of your baby's head starts to show (crown) first through your open cervix. Sometimes your baby may be in a different, abnormal position just before birth. These positions are called malpositions or malpresentations. Giving birth can be more difficult if your baby is in an abnormal position. Abnormal fetal positions There are five main abnormal fetal positions:  Occiput posterior presentation. This is the most common abnormal fetal position. It is sometimes called the sunny-side up position because your baby's face points toward your front instead of your back.  Breech presentation. This is also common. In this position, your baby's bottom or feet are in position to come out first.  Face or brow presentation. In this position, your baby is head down, but the face or the front of the head crowns first.  Compound presentation. In this position, your baby's hand or leg comes out along with the head or bottom.  Transverse presentation. In this position, your baby is lying sideways across your birth canal. Your baby's shoulder may come out first. Your health care provider can diagnose an abnormal fetal position during a physical exam as your due date approaches. An abnormal fetal position may be found by feeling your belly and by doing an internal (pelvic) exam. A sound wave imaging study (fetal ultrasound) can be done to confirm the abnormal position. What causes an abnormal fetal position? In many cases, the cause for an abnormal fetal position is not known. You may be at higher risk of having a baby in an abnormal fetal position if:  You have an abnormally shaped womb (uterus) or pelvis.  You have growths in your uterus,  such as fibroids.  Your placenta is large or in an abnormal position.  You are having twins or multiples.  You have too much or too little amniotic fluid.  Your baby has some type of developmental abnormality.  You go into early (premature) labor. How does this affect me? In some cases, your baby may abruptly move into a vertex position just before or during labor. However, an abnormal fetal position increases your risk for a long labor or the need for steps to be taken to help ensure a safe delivery. Your health care provider may need to:  Turn the baby manually by pushing on your belly (external cephalic version).  Use instruments, such as forceps or a suctioning device, to help get your baby through the birth canal (assisted delivery).  Deliver your baby by cesarean delivery, also called a C-section. The exact effects on your delivery will depend on the position your baby is in right before birth. If your baby has an occiput posterior presentation:  You may have to push longer and may have a longer labor.  You may have more back pain.  You may deliver vaginally, but you may also need an assisted delivery using forceps or vacuum device.  You may need a cesarean delivery. If your baby is breech:  Your health care provider may try a vaginal delivery. However, in most cases your health care provider will recommend a cesarean delivery to safely deliver your baby. If your baby is in a face or brow presentation:  Your labor may be longer.  You   may be able to have a vaginal or assisted vaginal delivery.  There is a higher-than-normal risk that you will need a cesarean delivery. If your baby is in a compound presentation:  Your health care provider may be able to change your baby's position manually.  In most cases, this position requires a cesarean delivery. If your baby is in a transverse presentation:  Your health care provider may be able to turn your baby manually.  In  most cases, this position requires a cesarean delivery.   How does this affect my baby? Most babies are not affected by an abnormal fetal position, but there is a higher risk of some complications, including:  Swelling and bruising.  Birth injuries.  Not getting enough oxygen during birth. Summary  The normal fetal position for birth is head down and facing toward your back (vertex position).  Abnormal fetal positions include occiput posterior, breech, face or brow presentation, compound presentation, and transverse position.  In some cases, your baby may abruptly move into a normal position before birth, or your health care provider may be able to change your baby's position manually.  If your baby is in an abnormal fetal position at the time of birth, you have a greater risk of a longer labor, assisted delivery, or a cesarean delivery. This information is not intended to replace advice given to you by your health care provider. Make sure you discuss any questions you have with your health care provider. Document Revised: 12/08/2019 Document Reviewed: 12/08/2019 Elsevier Patient Education  2021 Elsevier Inc.  

## 2020-05-30 NOTE — H&P (Signed)
Yvette Mosley is a 30 y.o. female presenting for evaluation for ECV [redacted]w[redacted]d [place] 3/22 breech, IUGR has resolved and BPP 8/8 that day, has f/u in MFM tomorrow. OB History    Gravida  8   Para  5   Term  4   Preterm  1   AB  2   Living  4     SAB  1   IAB  1   Ectopic      Multiple      Live Births  5          Past Medical History:  Diagnosis Date  . Diabetes mellitus without complication (HCC)   . Gestational diabetes   . History of preterm delivery   . Hx gestational diabetes    Past Surgical History:  Procedure Laterality Date  . CHOLECYSTECTOMY    . DILATION AND CURETTAGE OF UTERUS  2018  . THERAPEUTIC ABORTION     Family History: family history includes COPD in her father; Cancer in her father; Diabetes in her mother; Hypertension in her mother. Social History:  reports that she has quit smoking. Her smoking use included cigarettes. She has never used smokeless tobacco. She reports previous alcohol use. She reports previous drug use.     Maternal Diabetes: No Genetic Screening: Normal Maternal Ultrasounds/Referrals: Other: Fetal Ultrasounds or other Referrals:  Other:  Maternal Substance Abuse:  No Significant Maternal Medications:  None Significant Maternal Lab Results:  None Other Comments:  None  Review of Systems History   Blood pressure 122/83, pulse (!) 103, temperature (!) 97.4 F (36.3 C), temperature source Oral, resp. rate 18, height 5\' 3"  (1.6 m), weight 73 kg, last menstrual period 09/06/2019, SpO2 100 %. Maternal Exam:  Abdomen: Patient reports no abdominal tenderness. Fetal presentation: breech  Cervix: not evaluated.   Physical Exam Vitals and nursing note reviewed. Exam conducted with a chaperone present.  Constitutional:      Appearance: Normal appearance.  Cardiovascular:     Rate and Rhythm: Normal rate.  Pulmonary:     Effort: Pulmonary effort is normal.  Neurological:     Mental Status: She is alert.   Psychiatric:        Mood and Affect: Mood normal.        Behavior: Behavior normal.    11/07/2019- breech Prenatal labs: ABO, Rh:   Antibody:   Rubella:   RPR: Non Reactive (01/11 0813)  HBsAg:    HIV: Non Reactive (01/11 0813)  GBS: Negative/-- (03/15 1152)   Assessment/Plan Breech presentation After informed verbal consent, Terbutaline 0.25 mg SQ given, She consents to ECV, failure, labor, ROM, emergency CS, fetal intolerance, pain were discussed  09-17-1992 05/30/2020, 8:37 AM

## 2020-06-01 ENCOUNTER — Other Ambulatory Visit: Payer: Self-pay

## 2020-06-01 ENCOUNTER — Encounter: Payer: Medicaid Other | Admitting: Family Medicine

## 2020-06-01 ENCOUNTER — Ambulatory Visit: Payer: Medicaid Other | Admitting: *Deleted

## 2020-06-01 ENCOUNTER — Encounter: Payer: Self-pay | Admitting: *Deleted

## 2020-06-01 ENCOUNTER — Ambulatory Visit: Payer: Medicaid Other | Attending: Obstetrics and Gynecology

## 2020-06-01 DIAGNOSIS — O365930 Maternal care for other known or suspected poor fetal growth, third trimester, not applicable or unspecified: Secondary | ICD-10-CM | POA: Diagnosis not present

## 2020-06-01 DIAGNOSIS — Z8759 Personal history of other complications of pregnancy, childbirth and the puerperium: Secondary | ICD-10-CM

## 2020-06-01 DIAGNOSIS — O09891 Supervision of other high risk pregnancies, first trimester: Secondary | ICD-10-CM | POA: Insufficient documentation

## 2020-06-01 DIAGNOSIS — O0990 Supervision of high risk pregnancy, unspecified, unspecified trimester: Secondary | ICD-10-CM | POA: Diagnosis present

## 2020-06-07 ENCOUNTER — Telehealth: Payer: Self-pay | Admitting: *Deleted

## 2020-06-07 ENCOUNTER — Encounter (HOSPITAL_COMMUNITY): Payer: Self-pay | Admitting: Family Medicine

## 2020-06-07 ENCOUNTER — Other Ambulatory Visit: Payer: Self-pay

## 2020-06-07 ENCOUNTER — Inpatient Hospital Stay (HOSPITAL_COMMUNITY): Payer: Medicaid Other | Admitting: Anesthesiology

## 2020-06-07 ENCOUNTER — Inpatient Hospital Stay (HOSPITAL_COMMUNITY)
Admission: AD | Admit: 2020-06-07 | Discharge: 2020-06-09 | DRG: 807 | Disposition: A | Discharge status: Home / Self Care | Payer: Medicaid Other | Attending: Family Medicine | Admitting: Family Medicine

## 2020-06-07 ENCOUNTER — Telehealth: Payer: Self-pay | Admitting: Lactation Services

## 2020-06-07 DIAGNOSIS — Z20822 Contact with and (suspected) exposure to covid-19: Secondary | ICD-10-CM | POA: Diagnosis present

## 2020-06-07 DIAGNOSIS — O4212 Full-term premature rupture of membranes, onset of labor more than 24 hours following rupture: Secondary | ICD-10-CM | POA: Diagnosis not present

## 2020-06-07 DIAGNOSIS — O365930 Maternal care for other known or suspected poor fetal growth, third trimester, not applicable or unspecified: Secondary | ICD-10-CM | POA: Diagnosis present

## 2020-06-07 DIAGNOSIS — Z8482 Family history of sudden infant death syndrome: Secondary | ICD-10-CM

## 2020-06-07 DIAGNOSIS — Z87891 Personal history of nicotine dependence: Secondary | ICD-10-CM | POA: Diagnosis not present

## 2020-06-07 DIAGNOSIS — O321XX1 Maternal care for breech presentation, fetus 1: Secondary | ICD-10-CM | POA: Diagnosis not present

## 2020-06-07 DIAGNOSIS — Z3A38 38 weeks gestation of pregnancy: Secondary | ICD-10-CM

## 2020-06-07 DIAGNOSIS — O26893 Other specified pregnancy related conditions, third trimester: Secondary | ICD-10-CM | POA: Diagnosis present

## 2020-06-07 DIAGNOSIS — O09891 Supervision of other high risk pregnancies, first trimester: Secondary | ICD-10-CM

## 2020-06-07 DIAGNOSIS — O321XX0 Maternal care for breech presentation, not applicable or unspecified: Secondary | ICD-10-CM | POA: Diagnosis present

## 2020-06-07 DIAGNOSIS — F418 Other specified anxiety disorders: Secondary | ICD-10-CM

## 2020-06-07 DIAGNOSIS — O365990 Maternal care for other known or suspected poor fetal growth, unspecified trimester, not applicable or unspecified: Secondary | ICD-10-CM | POA: Diagnosis present

## 2020-06-07 DIAGNOSIS — O99342 Other mental disorders complicating pregnancy, second trimester: Secondary | ICD-10-CM | POA: Diagnosis present

## 2020-06-07 DIAGNOSIS — F32A Depression, unspecified: Secondary | ICD-10-CM | POA: Diagnosis present

## 2020-06-07 DIAGNOSIS — Z8632 Personal history of gestational diabetes: Secondary | ICD-10-CM | POA: Diagnosis present

## 2020-06-07 DIAGNOSIS — Z8759 Personal history of other complications of pregnancy, childbirth and the puerperium: Secondary | ICD-10-CM

## 2020-06-07 DIAGNOSIS — O0990 Supervision of high risk pregnancy, unspecified, unspecified trimester: Secondary | ICD-10-CM

## 2020-06-07 HISTORY — DX: Other complications of anesthesia, initial encounter: T88.59XA

## 2020-06-07 HISTORY — DX: Other reaction to spinal and lumbar puncture: G97.1

## 2020-06-07 LAB — COMPREHENSIVE METABOLIC PANEL
ALT: 11 U/L (ref 0–44)
AST: 22 U/L (ref 15–41)
Albumin: 3 g/dL — ABNORMAL LOW (ref 3.5–5.0)
Alkaline Phosphatase: 139 U/L — ABNORMAL HIGH (ref 38–126)
Anion gap: 10 (ref 5–15)
BUN: 6 mg/dL (ref 6–20)
CO2: 22 mmol/L (ref 22–32)
Calcium: 8.8 mg/dL — ABNORMAL LOW (ref 8.9–10.3)
Chloride: 104 mmol/L (ref 98–111)
Creatinine, Ser: 0.56 mg/dL (ref 0.44–1.00)
GFR, Estimated: >60 mL/min (ref >60)
Glucose, Bld: 110 mg/dL — ABNORMAL HIGH (ref 70–99)
Potassium: 3.5 mmol/L (ref 3.5–5.1)
Sodium: 136 mmol/L (ref 135–145)
Total Bilirubin: 0.7 mg/dL (ref 0.3–1.2)
Total Protein: 6.4 g/dL — ABNORMAL LOW (ref 6.5–8.1)

## 2020-06-07 LAB — POCT FERN TEST: POCT Fern Test: POSITIVE

## 2020-06-07 LAB — PROTEIN / CREATININE RATIO, URINE
Creatinine, Urine: 230.01 mg/dL
Protein Creatinine Ratio: 0.07 mg/mg{Cre} (ref 0.00–0.15)
Total Protein, Urine: 17 mg/dL

## 2020-06-07 LAB — RESP PANEL BY RT-PCR (FLU A&B, COVID) ARPGX2
Influenza A by PCR: NEGATIVE
Influenza B by PCR: NEGATIVE
SARS Coronavirus 2 by RT PCR: NEGATIVE

## 2020-06-07 LAB — TYPE AND SCREEN
ABO/RH(D): O POS
Antibody Screen: NEGATIVE

## 2020-06-07 LAB — CBC
HCT: 29.8 % — ABNORMAL LOW (ref 36.0–46.0)
Hemoglobin: 9.9 g/dL — ABNORMAL LOW (ref 12.0–15.0)
MCH: 33.6 pg (ref 26.0–34.0)
MCHC: 33.2 g/dL (ref 30.0–36.0)
MCV: 101 fL — ABNORMAL HIGH (ref 80.0–100.0)
Platelets: 169 10*3/uL (ref 150–400)
RBC: 2.95 MIL/uL — ABNORMAL LOW (ref 3.87–5.11)
RDW: 13.9 % (ref 11.5–15.5)
WBC: 10.3 10*3/uL (ref 4.0–10.5)
nRBC: 0 % (ref 0.0–0.2)

## 2020-06-07 MED ORDER — LACTATED RINGERS IV SOLN: 500.0000 mL | Freq: Once | INTRAVENOUS | Status: DC

## 2020-06-07 MED ORDER — DIPHENHYDRAMINE HCL 50 MG/ML IJ SOLN: 12.5000 mg | INTRAMUSCULAR | Status: DC | PRN

## 2020-06-07 MED ORDER — LIDOCAINE HCL (PF) 1 % IJ SOLN: 30.0000 mL | INTRAMUSCULAR | Status: DC | PRN

## 2020-06-07 MED ORDER — OXYCODONE-ACETAMINOPHEN 5-325 MG PO TABS
1.0000 | ORAL_TABLET | ORAL | Status: DC | PRN
Start: 2020-06-07 — End: 2020-06-08

## 2020-06-07 MED ORDER — BUPIVACAINE HCL (PF) 0.25 % IJ SOLN
INTRAMUSCULAR | Status: DC | PRN
Start: 2020-06-07 — End: 2020-06-08
  Administered 2020-06-07 – 2020-06-08 (×6): 5 mL via EPIDURAL

## 2020-06-07 MED ORDER — LACTATED RINGERS IV SOLN: INTRAVENOUS | Status: DC

## 2020-06-07 MED ORDER — FENTANYL CITRATE (PF) 100 MCG/2ML IJ SOLN
50.0000 ug | INTRAMUSCULAR | Status: DC | PRN
  Administered 2020-06-07: 100 ug via INTRAVENOUS
  Filled 2020-06-07 (×2): qty 2

## 2020-06-07 MED ORDER — PHENYLEPHRINE 40 MCG/ML (10ML) SYRINGE FOR IV PUSH (FOR BLOOD PRESSURE SUPPORT)
80.0000 ug | PREFILLED_SYRINGE | INTRAVENOUS | Status: DC | PRN
  Filled 2020-06-07: qty 10

## 2020-06-07 MED ORDER — ACETAMINOPHEN 325 MG PO TABS
650.0000 mg | ORAL_TABLET | ORAL | Status: DC | PRN
  Administered 2020-06-08: 650 mg via ORAL
  Filled 2020-06-07: qty 2

## 2020-06-07 MED ORDER — EPHEDRINE 5 MG/ML INJ: 10.0000 mg | INTRAVENOUS | Status: DC | PRN

## 2020-06-07 MED ORDER — SOD CITRATE-CITRIC ACID 500-334 MG/5ML PO SOLN: 30.0000 mL | ORAL | Status: DC | PRN

## 2020-06-07 MED ORDER — FENTANYL-BUPIVACAINE-NACL 0.5-0.125-0.9 MG/250ML-% EP SOLN
12.0000 mL/h | EPIDURAL | Status: DC | PRN
  Administered 2020-06-07: 12 mL/h via EPIDURAL
  Filled 2020-06-07: qty 250

## 2020-06-07 MED ORDER — OXYTOCIN BOLUS FROM INFUSION
333.0000 mL | Freq: Once | INTRAVENOUS | Status: AC
  Administered 2020-06-08: 333 mL via INTRAVENOUS

## 2020-06-07 MED ORDER — LACTATED RINGERS IV SOLN: 500.0000 mL | INTRAVENOUS | Status: DC | PRN

## 2020-06-07 MED ORDER — PHENYLEPHRINE 40 MCG/ML (10ML) SYRINGE FOR IV PUSH (FOR BLOOD PRESSURE SUPPORT)
80.0000 ug | PREFILLED_SYRINGE | INTRAVENOUS | Status: DC | PRN
  Administered 2020-06-07 – 2020-06-08 (×2): 80 ug via INTRAVENOUS

## 2020-06-07 MED ORDER — OXYTOCIN-SODIUM CHLORIDE 30-0.9 UT/500ML-% IV SOLN
2.5000 [IU]/h | INTRAVENOUS | Status: DC
  Administered 2020-06-08: 2.5 [IU]/h via INTRAVENOUS
  Filled 2020-06-07: qty 500

## 2020-06-07 MED ORDER — OXYCODONE-ACETAMINOPHEN 5-325 MG PO TABS
2.0000 | ORAL_TABLET | ORAL | Status: DC | PRN
Start: 2020-06-07 — End: 2020-06-08

## 2020-06-07 MED ORDER — OXYTOCIN-SODIUM CHLORIDE 30-0.9 UT/500ML-% IV SOLN
1.0000 m[IU]/min | INTRAVENOUS | Status: DC
  Administered 2020-06-07: 2 m[IU]/min via INTRAVENOUS

## 2020-06-07 MED ORDER — LIDOCAINE HCL (PF) 1 % IJ SOLN
INTRAMUSCULAR | Status: DC | PRN
  Administered 2020-06-07 – 2020-06-08 (×4): 5 mL via EPIDURAL

## 2020-06-07 MED ORDER — TERBUTALINE SULFATE 1 MG/ML IJ SOLN: 0.2500 mg | Freq: Once | INTRAMUSCULAR | Status: DC | PRN

## 2020-06-07 MED ORDER — ONDANSETRON HCL 4 MG/2ML IJ SOLN
4.0000 mg | Freq: Four times a day (QID) | INTRAMUSCULAR | Status: DC | PRN
  Administered 2020-06-08: 4 mg via INTRAVENOUS
  Filled 2020-06-07: qty 2

## 2020-06-07 NOTE — Anesthesia Procedure Notes (Signed)
Epidural Patient location during procedure: OB Start time: 06/07/2020 7:44 PM End time: 06/07/2020 7:01 PM  Staffing Anesthesiologist: Heather Roberts, MD Performed: anesthesiologist   Preanesthetic Checklist Completed: patient identified, IV checked, site marked, risks and benefits discussed, monitors and equipment checked, pre-op evaluation and timeout performed  Epidural Patient position: sitting Prep: DuraPrep Patient monitoring: heart rate, cardiac monitor, continuous pulse ox and blood pressure Approach: midline Location: L2-L3 Injection technique: LOR saline  Needle:  Needle type: Tuohy  Needle gauge: 17 G Needle length: 9 cm Needle insertion depth: 5 cm Catheter size: 20 Guage Catheter at skin depth: 10 cm Test dose: negative and Other  Assessment Events: blood not aspirated, injection not painful, no injection resistance and negative IV test  Additional Notes Informed consent obtained prior to proceeding including risk of failure, 1% risk of PDPH, risk of minor discomfort and bruising.  Discussed rare but serious complications including epidural abscess, permanent nerve injury, epidural hematoma.  Discussed alternatives to epidural analgesia and patient desires to proceed.  Timeout performed pre-procedure verifying patient name, procedure, and platelet count.  Patient tolerated procedure well.

## 2020-06-07 NOTE — MAU Provider Note (Signed)
History     CSN: [id number]  Arrival date and time: 06/07/20 1635    Event Date/Time   First Provider Initiated Contact with Patient 06/07/20 1707      Chief Complaint  Patient presents with  . Contractions  . Rupture of Membranes   HPI 30 yo [id number] at [redacted]w[redacted]d with history of GDM, and h/o preterm labor. Baby has been consistently breech for several weeks and patient had a failed ECV. She presents after her water broke at 3pm. Here, grossly ruptured membranes is evident. Confirmed breech on [place].  OB History    Gravida  8   Para  5   Term  4   Preterm  1   AB  2   Living  4     SAB  1   IAB  1   Ectopic      Multiple      Live Births  5           Past Medical History:  Diagnosis Date  . Diabetes mellitus without complication (HCC)   . Gestational diabetes   . History of preterm delivery   . Hx gestational diabetes     Past Surgical History:  Procedure Laterality Date  . CHOLECYSTECTOMY    . DILATION AND CURETTAGE OF UTERUS  2018  . THERAPEUTIC ABORTION      Family History  Problem Relation Age of Onset  . Diabetes Mother   . Hypertension Mother   . Cancer Father   . COPD Father     Social History   Tobacco Use  . Smoking status: Former Smoker    Types: Cigarettes  . Smokeless tobacco: Never Used  Vaping Use  . Vaping Use: Former  . Substances: Flavoring  Substance Use Topics  . Alcohol use: Not Currently  . Drug use: Not Currently    Allergies:  Allergies  Allergen Reactions  . Alpha-Gal Other (See Comments)    Abdominal pain   . Ceclor [Cefaclor] Hives    Medications Prior to Admission  Medication Sig Dispense Refill Last Dose  . promethazine (PHENERGAN) 25 MG tablet Take 25 mg by mouth every 6 (six) hours as needed for nausea or vomiting.   06/07/2020 at Unknown time  . ondansetron (ZOFRAN ODT) 4 MG disintegrating tablet Take 1 tablet (4 mg total) by mouth every 8 (eight) hours as needed for nausea or vomiting. 20 tablet 5  Unknown at Unknown time  . scopolamine (TRANSDERM-SCOP) 1 MG/3DAYS Place 1 patch onto the skin every 3 (three) days.   Unknown at Unknown time    Review of Systems Physical Exam   Blood pressure 118/72, pulse 90, temperature 98.1 F (36.7 C), temperature source Oral, resp. rate 18, last menstrual period 09/06/2019, SpO2 100 %.  Physical Exam Vitals and nursing note reviewed.  Constitutional:      Appearance: Normal appearance.  Cardiovascular:     Rate and Rhythm: Normal rate and regular rhythm.     Pulses: Normal pulses.     Heart sounds: Normal heart sounds.  Pulmonary:     Effort: Pulmonary effort is normal.  Abdominal:     General: Abdomen is flat.     Palpations: Abdomen is soft.  Skin:    General: Skin is warm and dry.     Capillary Refill: Capillary refill takes less than 2 seconds.  Neurological:     General: No focal deficit present.     Mental Status: She is alert.  Psychiatric:  Mood and Affect: Mood normal.        Behavior: Behavior normal.        Thought Content: Thought content normal.        Judgment: Judgment normal.   Dilation: 4 Effacement (%): 80 Station: 0 Presentation:  (breech) Exam by:: neill cnm   MAU Course  Procedures NST baseline 140. Mod variability. No accel or decel. Ctx: not well seen.  MDM   Assessment and Plan  Risks and benefits of breech vaginal delivery vs c/s discussed with patient. Patient opts for a vaginal delivery. Will admit to the labor floor.  Levie Heritage 06/07/2020, 5:07 PM

## 2020-06-07 NOTE — Progress Notes (Signed)
Pt c/o feeling normal sensation in vagina and both legs post epidural placement. Anesthesia notified and aware- no issues with epidural placement per MD. Will continue to monitor sensation and pain level.

## 2020-06-07 NOTE — Telephone Encounter (Addendum)
Pt left VM message stating that she has had a sore throat for 2 months. She went to Urgent Care and was given Rx Amoxicillin and told to take Zyrtec as well in case the problem was related to allergies. She has taken both medications and does not feel she is any better. She is having difficulty swallowing and wants to know what else she can take.   Pt called office @ 1433 and spoke with [facility] regarding different concerns of fluid leaking. She was advised to go to the hospital immediately. Pt stated that she is still having a sore throat. She was advised to perform frequent handwashing, wear a mask and to notify staff of her concern for sore throat during her hospital stay. Pt voiced understanding.

## 2020-06-07 NOTE — Progress Notes (Signed)
Labor Progress Note Yvette Mosley is a 30 y.o. [phone] at [redacted]w[redacted]d presented for labor management s/p SROM at 1500 on 06/07/20.  S: Pt reports persistent vaginal discomfort despite epidural and re-dosing by anesthesia. No other concerns at this time. Pt desires pitocin to "help move labor along".  O:  BP 110/63   Pulse 89   Temp 98 F (36.7 C) (Oral)   Resp 18   Ht 5\' 3"  (1.6 m)   Wt 73 kg   LMP 09/06/2019   SpO2 100%   BMI 28.51 kg/m  EFM: baseline 120/moderate variability/+accels/no decels Toco: irregular s/p epidural  CVE: Dilation: 4.5 Effacement (%): 70 Station: -1 Presentation: 11/07/2019 Exam by:: [email or web address], MD   A&P: 30 y.o. 26 [redacted]w[redacted]d presented for labor management s/p SROM at 1500 on 06/07/20. #Labor: S/p SROM at 1500 today. Given minimal cervical change since admission, IUPC placed and will start pitocin s/p shared decision making with pt. Will plan to recheck cervical exam in 4 hours or sooner as clinically indicated. #Pain: epidural in place #FWB: Category 1 strip #GBS negative  #Depression/history of infant death: plan for SW consult s/p delivery.  08/07/20, MD 8:53 PM

## 2020-06-07 NOTE — MAU Note (Signed)
Yvette Mosley is a 30 y.o. at [redacted]w[redacted]d here in MAU reporting: LOF since about 1500, clear fluid. Having some contractions. No bleeding. +FM  Onset of complaint: today  Pain score: 7/10  Vitals:   06/07/20 1644  BP: (!) 142/84  Pulse: 99  Resp: 18  Temp: 98.1 F (36.7 C)  SpO2: 99%     FHT: 143, EFM applied in room  Lab orders placed from triage: none

## 2020-06-07 NOTE — Telephone Encounter (Signed)
Patient reports she feels like she is leaking water from her Vagina. She just woke up with it and felt a small gush onto the bed and down her legs. She denies bleeding and has had some intermittent white and yellow discharge.   She has been having intermittently contractions in her stomach and back. They are pretty irregular right now. Reviewed they may pick up now.   Patient reports it will be a while (2.5 hours) before she can get to the hospital as she lives an hour from the hospital and her husband is picking up the kids. Advised patient to call husband and inform him that they need to head to the hospital as soon as possible.   Called MAU and gave report to Joni Reining that patient should be coming in for evaluation, advised that baby is breech.   Called patient back and advised her that she needs to leave for the hospital immediately. She was advised not to be up and about much and not to eat or drink prior to getting to the hospital. She is planning to call her mom and have her bring her, advised that is mom cannot and husband cannot that ambulance should be called to bring her, patient voiced understanding.   Patient reports she has had a sore throat for 2 months, advised to inform at the hospital, wear a mask and wash hands.   Called Dr. Crissie Reese and informed him of patient is coming in for delivery.

## 2020-06-07 NOTE — Progress Notes (Signed)
CNM called to bedside due to patient discomfort and known breech status. Patient grossly ruptured with clear fluid, fern positive. Cervix 4/80/0. Breech presentation confirmed with bedside ultrasound. Dr. Adrian Blackwater and Dr. Crissie Reese notified of patient arrival and assessment. Will come see patient.   Rolm Bookbinder, CNM 06/07/20 4:47 PM

## 2020-06-07 NOTE — H&P (Addendum)
OBSTETRIC ADMISSION HISTORY AND PHYSICAL  Bannie Ladan Vanderzanden is a 30 y.o. female [phone] with IUP at [redacted]w[redacted]d by LMP c/w 8wk u/s presenting for SROM @1500 /latent labor. She reports +FMs, no VB, no blurry vision, headaches or peripheral edema, and RUQ pain.  She plans on breast feeding. She request depo for birth control, she is also interested in interval tubal. She received her prenatal care at [facility]   Dating: By LMP c/w 8wk u/s --->  Estimated Date of Delivery: 06/20/20  Sono:    05/18/20@[redacted]w[redacted]d , CWD, normal anatomy, breech presentation, posterior fundal placental lie, 2366g, 19% EFW   Prenatal History/Complications:  Breech presentation (failed ECV) Previously FGR (5%ile @[redacted]w[redacted]d , resolved) History of VAVD History of infant death (SIDS) Transfer of care (moved from 05/20/20 at 18wk)   Past Medical History: Past Medical History:  Diagnosis Date  . Diabetes mellitus without complication (HCC)   . Gestational diabetes   . History of preterm delivery   . Hx gestational diabetes     Past Surgical History: Past Surgical History:  Procedure Laterality Date  . CHOLECYSTECTOMY    . DILATION AND CURETTAGE OF UTERUS  2018  . THERAPEUTIC ABORTION      Obstetrical History: OB History    Gravida  8   Para  5   Term  4   Preterm  1   AB  2   Living  4     SAB  1   IAB  1   Ectopic      Multiple      Live Births  5           Social History Social History   Socioeconomic History  . Marital status: Married    Spouse name: Not on file  . Number of children: Not on file  . Years of education: Not on file  . Highest education level: Not on file  Occupational History  . Not on file  Tobacco Use  . Smoking status: Former Smoker    Types: Cigarettes  . Smokeless tobacco: Never Used  Vaping Use  . Vaping Use: Former  . Substances: Flavoring  Substance and Sexual Activity  . Alcohol use: Not Currently  . Drug use: Not Currently  . Sexual activity: Yes  Other Topics  Concern  . Not on file  Social History Narrative  . Not on file   Social Determinants of Health   Financial Resource Strain: Not on file  Food Insecurity: No Food Insecurity  . Worried About [place] in the Last Year: Never true  . Ran Out of Food in the Last Year: Never true  Transportation Needs: No Transportation Needs  . Lack of Transportation (Medical): No  . Lack of Transportation (Non-Medical): No  Physical Activity: Not on file  Stress: Not on file  Social Connections: Not on file    Family History: Family History  Problem Relation Age of Onset  . Diabetes Mother   . Hypertension Mother   . Cancer Father   . COPD Father     Allergies: Allergies  Allergen Reactions  . Alpha-Gal Other (See Comments)    Abdominal pain   . Ceclor [Cefaclor] Hives    Medications Prior to Admission  Medication Sig Dispense Refill Last Dose  . promethazine (PHENERGAN) 25 MG tablet Take 25 mg by mouth every 6 (six) hours as needed for nausea or vomiting.   06/07/2020 at Unknown time  . ondansetron (ZOFRAN ODT) 4 MG disintegrating tablet Take  1 tablet (4 mg total) by mouth every 8 (eight) hours as needed for nausea or vomiting. 20 tablet 5 Unknown at Unknown time  . scopolamine (TRANSDERM-SCOP) 1 MG/3DAYS Place 1 patch onto the skin every 3 (three) days.   Unknown at Unknown time     Review of Systems   All systems reviewed and negative except as stated in HPI  Blood pressure 118/69, pulse 89, temperature 98.1 F (36.7 C), temperature source Oral, resp. rate 18, last menstrual period 09/06/2019, SpO2 99 %. General appearance: alert, cooperative and no distress Lungs: normal respiratory effort Heart: regular rate and rhythm Abdomen: soft, non-tender; gravid Pelvic: as noted below Extremities: Homans sign is negative, no sign of DVT Presentation: breech Fetal monitoringBaseline: 140 bpm, Variability: Good {> 6 bpm), Accelerations: Reactive and Decelerations:  Absent Uterine activity difficult to trace Dilation: 4 Effacement (%): 80 Station: 0 Exam by:: neill cnm   Prenatal labs: ABO, Rh: --/--/PENDING (04/04 1650) Antibody: PENDING (04/04 1650) Rubella:  immune RPR: Non Reactive (01/11 0813)  HBsAg:   nonreactive HIV: Non Reactive (01/11 0813)  GBS: Negative/-- (03/15 1152)  2 hr Glucola passed Genetic screening normal Anatomy US FGR (5%ile), otherwise normal  Prenatal Transfer Tool  Maternal Diabetes: No Genetic Screening: Normal Maternal Ultrasounds/Referrals: Normal Fetal Ultrasounds or other Referrals:  Referred to Materal Fetal Medicine  Maternal Substance Abuse:  No Significant Maternal Medications:  None Significant Maternal Lab Results: Group B Strep negative  Results for orders placed or performed during the hospital encounter of 06/07/20 (from the past 24 hour(s))  Type and screen [facility]   Collection Time: 06/07/20  4:50 PM  Result Value Ref Range   ABO/RH(D) PENDING    Antibody Screen PENDING    Sample Expiration      06/10/2020,2359 Performed at [facility], [address] [place], [place] [address]   Crist Fat Test   Collection Time: 06/07/20  5:06 PM  Result Value Ref Range   POCT Fern Test Positive = ruptured amniotic membanes     Patient Active Problem List   Diagnosis Date Noted  . Breech presentation 05/30/2020  . Breech presentation of fetus 05/18/2020  . IUGR, antenatal 03/30/2020  . History of oligohydramnios 03/16/2020  . History of pre-eclampsia 02/19/2020  . Short interval between pregnancies affecting pregnancy in first trimester, antepartum 02/19/2020  . Supervision of high risk pregnancy, antepartum 03-08-20  . History of gestational diabetes 03-08-2020  . Family history of SIDS (sudden infant death syndrome) 03-08-2020  . Depression affecting pregnancy in second trimester, antepartum March 08, 2020    Assessment/Plan:  Joice Lofts Davonne Baby is a 72 y.o. [id number]  at [redacted]w[redacted]d here for latent labor/SROM @1500 , breech presentation.  #Labor: Dr. and Dr. Adrian Blackwater previously discussed risks/benefits of breech delivery vs pLTCS with patient, patient elects for vaginal delivery. Will continue expectant management at this time. #Pain: PRN #FWB: Cat 1 #ID: GBS neg #MOF: breast #MOC: depo #Circ: n/a  #History of GDM prior pregnancy: passed 2hr gtt this pregnancy  #History of gHTN prior pregnancy: no elevated BP this pregnancy. BP elevated on admit 142/84, now 118/69. Asymptomatic. PreE labs pending.  #Depression/history of infant death: SW postpartum. Not on meds.  Crissie Reese, MD  06/07/2020, 5:36 PM

## 2020-06-07 NOTE — Anesthesia Preprocedure Evaluation (Addendum)
Anesthesia Evaluation  Patient identified by MRN, date of birth, ID band Patient awake    Reviewed: Allergy & Precautions, NPO status , Patient's Chart, lab work & pertinent test results  History of Anesthesia Complications (+) POST - OP SPINAL HEADACHE and history of anesthetic complications  Airway Mallampati: II  TM Distance: >3 FB Neck ROM: Full    Dental no notable dental hx. (+) Dental Advisory Given   Pulmonary neg pulmonary ROS, former smoker,    Pulmonary exam normal        Cardiovascular negative cardio ROS Normal cardiovascular exam     Neuro/Psych negative neurological ROS  negative psych ROS   GI/Hepatic negative GI ROS, Neg liver ROS,   Endo/Other  negative endocrine ROSdiabetes  Renal/GU negative Renal ROS  negative genitourinary   Musculoskeletal negative musculoskeletal ROS (+)   Abdominal   Peds negative pediatric ROS (+)  Hematology negative hematology ROS (+)   Anesthesia Other Findings   Reproductive/Obstetrics (+) Pregnancy                            Anesthesia Physical Anesthesia Plan  ASA: II  Anesthesia Plan: Epidural   Post-op Pain Management:    Induction:   PONV Risk Score and Plan:   Airway Management Planned: Natural Airway  Additional Equipment:   Intra-op Plan:   Post-operative Plan:   Informed Consent: I have reviewed the patients History and Physical, chart, labs and discussed the procedure including the risks, benefits and alternatives for the proposed anesthesia with the patient or authorized representative who has indicated his/her understanding and acceptance.     Dental advisory given  Plan Discussed with: Anesthesiologist  Anesthesia Plan Comments:         Anesthesia Quick Evaluation

## 2020-06-08 ENCOUNTER — Encounter: Payer: Medicaid Other | Admitting: Family Medicine

## 2020-06-08 ENCOUNTER — Ambulatory Visit: Payer: Medicaid Other

## 2020-06-08 ENCOUNTER — Other Ambulatory Visit: Payer: Medicaid Other

## 2020-06-08 ENCOUNTER — Encounter (HOSPITAL_COMMUNITY): Payer: Self-pay | Admitting: Family Medicine

## 2020-06-08 DIAGNOSIS — O321XX1 Maternal care for breech presentation, fetus 1: Secondary | ICD-10-CM

## 2020-06-08 DIAGNOSIS — Z3A38 38 weeks gestation of pregnancy: Secondary | ICD-10-CM

## 2020-06-08 DIAGNOSIS — O4212 Full-term premature rupture of membranes, onset of labor more than 24 hours following rupture: Secondary | ICD-10-CM

## 2020-06-08 LAB — RPR: RPR Ser Ql: NONREACTIVE

## 2020-06-08 MED ORDER — HYDROXYZINE HCL 50 MG PO TABS
25.0000 mg | ORAL_TABLET | Freq: Once | ORAL | Status: AC
  Administered 2020-06-08: 25 mg via ORAL
  Filled 2020-06-08: qty 1

## 2020-06-08 MED ORDER — ONDANSETRON HCL 4 MG PO TABS: 4.0000 mg | ORAL_TABLET | ORAL | Status: DC | PRN

## 2020-06-08 MED ORDER — DIBUCAINE (PERIANAL) 1 % EX OINT: 1.0000 "application " | TOPICAL_OINTMENT | CUTANEOUS | Status: DC | PRN

## 2020-06-08 MED ORDER — IBUPROFEN 600 MG PO TABS
600.0000 mg | ORAL_TABLET | Freq: Four times a day (QID) | ORAL | Status: DC
  Administered 2020-06-08 – 2020-06-09 (×5): 600 mg via ORAL
  Filled 2020-06-08 (×5): qty 1

## 2020-06-08 MED ORDER — BENZOCAINE-MENTHOL 20-0.5 % EX AERO
1.0000 "application " | INHALATION_SPRAY | CUTANEOUS | Status: DC | PRN
  Administered 2020-06-08: 1 via TOPICAL
  Filled 2020-06-08: qty 56

## 2020-06-08 MED ORDER — FENTANYL CITRATE (PF) 100 MCG/2ML IJ SOLN
INTRAMUSCULAR | Status: DC | PRN
  Administered 2020-06-08 (×2): 50 ug via EPIDURAL

## 2020-06-08 MED ORDER — METHYLERGONOVINE MALEATE 0.2 MG/ML IJ SOLN
0.2000 mg | Freq: Once | INTRAMUSCULAR | Status: AC
  Administered 2020-06-08: 0.2 mg via INTRAMUSCULAR

## 2020-06-08 MED ORDER — ONDANSETRON HCL 4 MG/2ML IJ SOLN: 4.0000 mg | INTRAMUSCULAR | Status: DC | PRN

## 2020-06-08 MED ORDER — METHYLERGONOVINE MALEATE 0.2 MG/ML IJ SOLN
INTRAMUSCULAR | Status: AC
  Filled 2020-06-08: qty 1

## 2020-06-08 MED ORDER — MEDROXYPROGESTERONE ACETATE 150 MG/ML IM SUSP: 150.0000 mg | Freq: Once | INTRAMUSCULAR | Status: DC

## 2020-06-08 MED ORDER — COCONUT OIL OIL
1.0000 "application " | TOPICAL_OIL | Status: DC | PRN
  Administered 2020-06-08: 1 via TOPICAL

## 2020-06-08 MED ORDER — TRANEXAMIC ACID-NACL 1000-0.7 MG/100ML-% IV SOLN
INTRAVENOUS | Status: AC
  Filled 2020-06-08: qty 100

## 2020-06-08 MED ORDER — PRENATAL MULTIVITAMIN CH
1.0000 | ORAL_TABLET | Freq: Every day | ORAL | Status: DC
  Administered 2020-06-08 – 2020-06-09 (×2): 1 via ORAL
  Filled 2020-06-08 (×2): qty 1

## 2020-06-08 MED ORDER — ACETAMINOPHEN 325 MG PO TABS
650.0000 mg | ORAL_TABLET | Freq: Four times a day (QID) | ORAL | Status: DC
  Administered 2020-06-08 – 2020-06-09 (×4): 650 mg via ORAL
  Filled 2020-06-08 (×4): qty 2

## 2020-06-08 MED ORDER — SENNOSIDES-DOCUSATE SODIUM 8.6-50 MG PO TABS
2.0000 | ORAL_TABLET | Freq: Every day | ORAL | Status: DC
  Filled 2020-06-08: qty 2

## 2020-06-08 MED ORDER — DIPHENHYDRAMINE HCL 25 MG PO CAPS: 25.0000 mg | ORAL_CAPSULE | Freq: Four times a day (QID) | ORAL | Status: DC | PRN

## 2020-06-08 MED ORDER — SIMETHICONE 80 MG PO CHEW
80.0000 mg | CHEWABLE_TABLET | ORAL | Status: DC | PRN
  Administered 2020-06-09: 80 mg via ORAL
  Filled 2020-06-08: qty 1

## 2020-06-08 MED ORDER — TETANUS-DIPHTH-ACELL PERTUSSIS 5-2.5-18.5 LF-MCG/0.5 IM SUSY: 0.5000 mL | PREFILLED_SYRINGE | Freq: Once | INTRAMUSCULAR | Status: DC

## 2020-06-08 MED ORDER — TRANEXAMIC ACID-NACL 1000-0.7 MG/100ML-% IV SOLN
1000.0000 mg | Freq: Once | INTRAVENOUS | Status: AC
  Administered 2020-06-08: 1000 mg via INTRAVENOUS

## 2020-06-08 MED ORDER — WITCH HAZEL-GLYCERIN EX PADS: 1.0000 "application " | MEDICATED_PAD | CUTANEOUS | Status: DC | PRN

## 2020-06-08 NOTE — Lactation Note (Signed)
This note was copied from a baby's chart. Lactation Consultation Note  Patient Name: [name] Today's Date: 06/08/2020   Age:29 hours  Lc attempted to see.  Patient sleeping.  Maternal Data    Feeding    LATCH Score                    Lactation Tools Discussed/Used    Interventions    Discharge    Consult Status      Neomia Dear 06/08/2020, 6:23 PM

## 2020-06-08 NOTE — Lactation Note (Signed)
This note was copied from a baby's chart. Lactation Consultation Note  Patient Name: Yvette Mosley Today's Date: 06/08/2020   Age:29 hours  Attempted to see mom again.  Mom, baby and family sleeping.  Mom woke up when [facility] went in room.  Mom reports she thinks they are good right now. Mom reports she does not want to be seen until tomorrow. Did not get breastfeeding hx or any info from mom at this time.   Maternal Data    Feeding    LATCH Score                    Lactation Tools Discussed/Used    Interventions    Discharge    Consult Status      Neomia Dear 06/08/2020, 8:58 PM

## 2020-06-08 NOTE — Social Work (Signed)
CSW acknowledges consult for MOB scoring 13 on Edinburgh. CSW spoke with MOB earlier in the day, providing MOB with education on PPD. MOB declined therapy resources however MOB support person accepted them on her behalf.   Yvette Mosley, MSW, LCSW [facility]  Clinical Social Worker  [phone] 06/08/2020  3:46 PM   

## 2020-06-08 NOTE — Progress Notes (Signed)
Labor Progress Note Eshaal Grayce Budden is a 30 y.o. [phone] at [redacted]w[redacted]d presented for labor management s/p SROM at 1500 on 06/07/20.  S: Pt continues to have vaginal discomfort despite epidural. No other concerns at this time.  O:  BP (!) 107/59   Pulse 75   Temp (!) 97.5 F (36.4 C) (Oral)   Resp 18   Ht 5\' 3"  (1.6 m)   Wt 73 kg   LMP 09/06/2019   SpO2 100%   BMI 28.51 kg/m  EFM: baseline 120/moderate variability/+accels/no decels Toco: ctx every 5-6 min  CVE: Dilation: 5 Effacement (%): 70,80 Station: 0 Presentation: 11/07/2019 Exam by:: [email or web address], MD   A&P: 30 y.o. 26 [redacted]w[redacted]d presented for labor management s/p SROM at 1500 on 06/07/20. #Labor: S/p SROM at 1500 today. Pitocin since 2100 given minimal cervical change. Pt continues to have inadequate contractions despite up-titration of pitocin. Will continue to up-titrate pitocin and recheck cervical exam in 4 hours or sooner as clinically indicated. #Pain: epidural in place #FWB: Category 1 strip #GBS negative  #Depression/history of infant death: plan for SW consult s/p delivery.  2101, MD 1:10 AM

## 2020-06-08 NOTE — Anesthesia Postprocedure Evaluation (Signed)
Anesthesia Post Note  Patient: Web designer  Procedure(s) Performed: AN AD HOC LABOR EPIDURAL     Patient location during evaluation: Mother Baby Anesthesia Type: Epidural Level of consciousness: awake and alert Pain management: pain level controlled Vital Signs Assessment: post-procedure vital signs reviewed and stable Respiratory status: spontaneous breathing, nonlabored ventilation and respiratory function stable Cardiovascular status: stable Postop Assessment: no headache, no backache and epidural receding Anesthetic complications: no   No complications documented.  Last Vitals:  Vitals:   06/08/20 0642 06/08/20 0750  BP: 112/74 110/74  Pulse: 65 76  Resp: 17   Temp: 36.6 C 36.9 C  SpO2: 99%     Last Pain:  Vitals:   06/08/20 0750  TempSrc: Oral  PainSc: 0-No pain   Pain Goal:                   [employer]

## 2020-06-08 NOTE — Lactation Note (Signed)
This note was copied from a baby's chart. Lactation Consultation Note  Patient Name: Yvette Mosley Today's Date: 06/08/2020   Age:29 hours  LC attempted to see.  Mom needed to go to rest room.  Mom had inquired about donor milk.  Gave handout.  Mom needed to go to rest room asked LC to come back later  Maternal Data    Feeding    LATCH Score                    Lactation Tools Discussed/Used    Interventions    Discharge    Consult Status      Neomia Dear 06/08/2020, 6:24 PM

## 2020-06-08 NOTE — Anesthesia Procedure Notes (Signed)
Epidural Patient location during procedure: OB Start time: 06/08/2020 2:54 AM End time: 06/08/2020 3:14 AM  Staffing Anesthesiologist: Heather Roberts, MD Performed: anesthesiologist   Preanesthetic Checklist Completed: patient identified, IV checked, site marked, risks and benefits discussed, monitors and equipment checked, pre-op evaluation and timeout performed  Epidural Patient position: sitting Prep: DuraPrep Patient monitoring: heart rate, cardiac monitor, continuous pulse ox and blood pressure Approach: midline Location: L3-L4 Injection technique: LOR saline  Needle:  Needle type: Tuohy  Needle gauge: 17 G Needle length: 9 cm Needle insertion depth: 6 cm Catheter size: 20 Guage Catheter at skin depth: 12 cm Test dose: negative and Other  Assessment Events: blood not aspirated, injection not painful, no injection resistance and negative IV test  Additional Notes Informed consent obtained prior to proceeding including risk of failure, 1% risk of PDPH, risk of minor discomfort and bruising.  Discussed rare but serious complications including epidural abscess, permanent nerve injury, epidural hematoma.  Discussed alternatives to epidural analgesia and patient desires to proceed.  Timeout performed pre-procedure verifying patient name, procedure, and platelet count.  Patient tolerated procedure well.

## 2020-06-08 NOTE — Social Work (Signed)
CSW received consult for hx of depression.  CSW met with MOB to offer support and complete assessment.    CSW met the patient at bedside. CSW congratulated MOB and introduced role. CSW observed MOB mother at bedside. MOB was breastfeeding and bonding with the infant. CSW offered MOB privacy. MOB agreeable for mother to stay in the room. CSW asked MOB how she has felt since giving birth. MOB reports feeling tired. CSW explain the reason for the visit, to offer resources. MOB receptive. CSW asked MOB about her mental health history. MOB reports history of depression. MOB reports she was in therapy for about a month but stopped going because she felt it was not helping her. MOB reports her physician prescribed Venlafaxine, but she stopped taking the medication after finding out she was pregnant. MOB reports the medication is not helping and does not plan to resume it. MOB reports she has been on several other medications that were prescribed by a psychiatrist and physician including Prozac, Zoloft, Lexapro and Celexa. MOB reports the medications were not helpful. MOB mother interjected and stated, MOB has a lot of emotions surrounding the death of her baby 9  month old from SIDS and her father that passed away in 2017-05-12. MOB reports, "nothing has help me."  MOB declined to talk about it further. CSW respected MOB wishes and proceeded.   CSW asked MOB about her supports. MOB acknowledged her mother, husband and her in laws as supports. MOB reports that she, her spouse and children recently moved to [place], [place] to be closer to her mother. MOB mother reports they live a few blocks from her home.   CSW recommended MOB complete a self-evaluation during the postpartum time period using the New Mom Checklist from Postpartum Progress. CSW encouraged MOB mother to look at the checklist as well for MOB and encouraged them to contact a medical professional if symptoms are noted at any time. CSW again offered  therapy and provided education regarding the baby blues period vs. perinatal mood disorders discussed treatment and gave resources for mental health and encouraged MOB to follow up if concerns arise.  MOB declined resources.   CSW apologized to [facility] about SIDS being a sensitive topic for her and informed her that CSW would review SIDS education. MOB was understanding. CSW reviewed Sudden Infant Death Syndrome (SIDS) precautions and informed MOB of no-co sleeping with the infant. CSW asked MOB if she has items for the infants, MOB reports, yes, the infant has a pack in play and car seat. CSW asked MOB if she as chosen a Lexicographer. MOB reports she contacted Medicaid to stop services in [place] and transfer them to [place].  MOB reports the [facility] Medicaid will be active for the next thirty days. MOB is coordinating follow up care for the infant to be seen in [place]. CSW assessed MOB for additional needs. MOB reports no further need.     CSW identifies no further need for intervention and no barriers to discharge at this time.  Kathrin Greathouse, MSW, LCSW [facility] [place] [facility]  [phone] 06/08/2020  1:01 PM

## 2020-06-08 NOTE — Discharge Summary (Addendum)
Postpartum Discharge Summary  Date of Service updated    Patient Name: Yvette Mosley DOB: 07-17-1990 MRN: [id number]  Date of admission: 06/07/2020 Delivery date:06/08/2020  Delivering provider: Donnamae Jude  Date of discharge: 06/09/2020  Admitting diagnosis: Normal labor [O80, Z37.9] Intrauterine pregnancy: [redacted]w[redacted]d    Secondary diagnosis:  Principal Problem:   Vaginal delivery Active Problems:   Supervision of high risk pregnancy, antepartum   History of gestational diabetes   Family history of SIDS (sudden infant death syndrome)   Depression affecting pregnancy in second trimester, antepartum   History of pre-eclampsia   Short interval between pregnancies affecting pregnancy in first trimester, antepartum   History of oligohydramnios   IUGR, antenatal   Breech presentation  Additional problems: as noted above   Discharge diagnosis: Vaginal Delivery, Breech Presentation                                          Post partum procedures: None Augmentation: Pitocin Complications: None  Hospital course: Onset of Labor With Vaginal Delivery      30y.o. yo [id number] admitted in Latent Labor s/p SROM on 06/07/2020. Prior discussions with pt regarding breech vaginal delivery versus pLTCS. On admission, pt confirmed desire for vaginal delivery. Patient had an uncomplicated labor course as follows:  Membrane Rupture Time/Date: 3:00 PM ,06/07/2020   Delivery Method:Vaginal, Breech  Episiotomy: None  Lacerations:  None  Patient had an uncomplicated postpartum course.  She is ambulating, tolerating a regular diet, passing flatus, and urinating well. Patient is discharged home in stable condition on 06/09/20.  Newborn Data: Birth date:06/08/2020  Birth time:3:42 AM  Gender:Female  Living status:Living  Apgars:3 ,7  Weight:3084 g   Magnesium Sulfate received: No BMZ received: No Rhophylac:N/A MMR:N/A T-DaP:Given prenatally Flu: Yes Transfusion:No  Physical exam   Vitals:   06/08/20 1145 06/08/20 1615 06/08/20 2015 06/09/20 0605  BP: 104/76 110/68 114/68 (!) 93/54  Pulse: 70 68 70   Resp: _0 Temp: 98 F (36.7 C) 97.8 F (36.6 C) 97.8 F (36.6 C) 98.5 F (36.9 C)  TempSrc: Oral Oral Oral Oral  SpO2:   99% 99%  Weight:      Height:       General: alert, cooperative and no distress Lochia: appropriate Uterine Fundus: firm  Labs: Lab Results  Component Value Date   WBC 10.3 06/07/2020   HGB 9.9 (L) 06/07/2020   HCT 29.8 (L) 06/07/2020   MCV 101.0 (H) 06/07/2020   PLT 169 06/07/2020   CMP Latest Ref Rng & Units 06/07/2020  Glucose 70 - 99 mg/dL 110(H)  BUN 6 - 20 mg/dL 6  Creatinine 0.44 - 1.00 mg/dL 0.56  Sodium 135 - 145 mmol/L 136  Potassium 3.5 - 5.1 mmol/L 3.5  Chloride 98 - 111 mmol/L 104  CO2 22 - 32 mmol/L 22  Calcium 8.9 - 10.3 mg/dL 8.8(L)  Total Protein 6.5 - 8.1 g/dL 6.4(L)  Total Bilirubin 0.3 - 1.2 mg/dL 0.7  Alkaline Phos 38 - 126 U/L 139(H)  AST 15 - 41 U/L 22  ALT 0 - 44 U/L 11   Edinburgh Score: Edinburgh Postnatal Depression Scale Screening Tool 06/08/2020  I have been able to laugh and see the funny side of things. 1  I have looked forward with enjoyment to things. 1  I have blamed myself  unnecessarily when things went wrong. 2  I have been anxious or worried for no good reason. 2  I have felt scared or panicky for no good reason. 2  Things have been getting on top of me. 2  I have been so unhappy that I have had difficulty sleeping. 1  I have felt sad or miserable. 1  I have been so unhappy that I have been crying. 1  The thought of harming myself has occurred to me. 0  Edinburgh Postnatal Depression Scale Total 13     After visit meds:  Allergies as of 06/09/2020       Reactions   Alpha-gal Diarrhea, Nausea And Vomiting, Other (See Comments)   Abdominal pain    Ceclor [cefaclor] Hives        Medication List     STOP taking these medications    amoxicillin 500 MG capsule Commonly  known as: AMOXIL   cetirizine 10 MG tablet Commonly known as: ZYRTEC   ondansetron 4 MG disintegrating tablet Commonly known as: Zofran ODT   promethazine 25 MG tablet Commonly known as: PHENERGAN       TAKE these medications    ibuprofen 600 MG tablet Commonly known as: ADVIL Take 1 tablet (600 mg total) by mouth every 6 (six) hours as needed for mild pain, moderate pain or cramping.         Discharge home in stable condition Infant Feeding: Breast Infant Disposition:home with mother Discharge instruction: per After Visit Summary and Postpartum booklet. Activity: Advance as tolerated. Pelvic rest for 6 weeks.  Diet: routine diet Future Appointments: Future Appointments  Date Time Provider [place]  06/15/2020  2:00 PM [facility] NURSE [facility]  06/22/2020  2:15 PM [facility]  07/19/2020  2:15 PM Griffin Basil, MD [facility]   Follow up Visit:  Message sent to [facility] by Dr. Astrid Drafts  Please schedule this patient for a In person postpartum visit in 6 weeks with the following provider: Any provider. Additional Postpartum F/U:Postpartum Depression checkup, 1 week BP check  Low risk pregnancy complicated by:  breech presentation, h/o gHTN, h/o gDM, h/o infant death, depression (no meds) Delivery mode:  Vaginal, Breech  Anticipated Birth Control: Depo   06/09/2020 Delora Fuel, MD  I spoke with and examined patient and agree with resident/PA-S/MS/SNM's note and plan of care.  Roma Schanz, CNM, [facility] 06/14/2020 12:43 PM

## 2020-06-08 NOTE — Discharge Instructions (Signed)

## 2020-06-09 MED ORDER — IBUPROFEN 600 MG PO TABS: 600.0000 mg | ORAL_TABLET | Freq: Four times a day (QID) | ORAL | 0 refills | Status: AC | PRN

## 2020-06-09 NOTE — Lactation Note (Addendum)
This note was copied from a baby's chart. Lactation Consultation Note  Patient Name: Yvette Mosley Today's Date: 06/09/2020 Reason for consult: Early term 37-38.6wks;Follow-up assessment Age:29 hours   P5 mother whose infant is now 25 hours old.  This is an ETI at 38+2 weeks.  Mother attempted to breast feed her other children ( 84, 5, 59 and 82 year old) but only fed for a couple weeks with each one.  Baby was awake and I offered to assist with latching.  Mother willing to try.  Undressed baby and asked mother to demonstrate hand expression.  After revising her technique she was able to express 2 mls of colostrum which I spoon fed back to baby.  Suck training performed.  Baby initially was biting on my gloved finger and uncoordinated with her suck.  After training she began to suck rhythmically.  Assisted to latch in the cross cradle position on the right breast.  Observed her feeding for 12 minutes prior to departure.  Mother inclined to use the cradle hold which I cautioned her about in the early learning period.  Education provided.  Encouraged EBM and coconut oil to nipples/areolas after feeding.  Mother requested a manual breast pump.  Provided a pump with instructions for use. Engorgement prevention/treatment reviewed.  RN in room during my visit.   Maternal Data Has patient been taught Hand Expression?: Yes Does the patient have breastfeeding experience prior to this delivery?: Yes How long did the patient breastfeed?: Only a couple weeks with her other four children  Feeding Mother's Current Feeding Choice: Breast Milk and Formula  LATCH Score Latch: Repeated attempts needed to sustain latch, nipple held in mouth throughout feeding, stimulation needed to elicit sucking reflex.  Audible Swallowing: A few with stimulation  Type of Nipple: Everted at rest and after stimulation (short shafted)  Comfort (Breast/Nipple): Soft / non-tender  Hold (Positioning): Assistance needed to  correctly position infant at breast and maintain latch.  LATCH Score: 7   Lactation Tools Discussed/Used    Interventions Interventions: Breast feeding basics reviewed;Assisted with latch;Skin to skin;Breast massage;Hand express;Breast compression;Adjust position;Coconut oil;Expressed milk;Position options;Support pillows;Education  Discharge Pump: Manual  Consult Status Consult Status: Follow-up Date: 06/10/20 Follow-up type: In-patient    Bernardine Langworthy R Caydn Justen 06/09/2020, 10:03 AM

## 2020-06-10 NOTE — BH Specialist Note (Signed)
Pt did not arrive to video visit and did not answer the phone ; Left HIPPA-compliant message to call back Yvette Mosley from [facility] at [phone] (main office) or [phone] (Renaldo Gornick's office).   

## 2020-06-15 ENCOUNTER — Ambulatory Visit: Payer: Medicaid Other

## 2020-06-15 NOTE — Progress Notes (Deleted)
Left message with pt about missed appt today and advised to reschedule BP check.  Judeth Cornfield, RN

## 2020-06-22 ENCOUNTER — Ambulatory Visit: Payer: Self-pay | Admitting: Clinical

## 2020-06-22 ENCOUNTER — Other Ambulatory Visit: Payer: Self-pay | Admitting: Women's Health

## 2020-06-22 DIAGNOSIS — Z5329 Procedure and treatment not carried out because of patient's decision for other reasons: Secondary | ICD-10-CM

## 2020-06-22 DIAGNOSIS — Z91199 Patient's noncompliance with other medical treatment and regimen due to unspecified reason: Secondary | ICD-10-CM

## 2020-06-22 MED ORDER — FERROUS SULFATE 325 (65 FE) MG PO TABS: 325.0000 mg | ORAL_TABLET | ORAL | 2 refills | Status: AC

## 2020-07-19 ENCOUNTER — Ambulatory Visit: Payer: Medicaid Other | Admitting: Obstetrics and Gynecology

## 2020-07-20 ENCOUNTER — Ambulatory Visit: Payer: Medicaid Other | Admitting: Nurse Practitioner

## 2020-07-20 ENCOUNTER — Ambulatory Visit: Payer: Medicaid Other | Admitting: Student

## 2021-12-03 IMAGING — US US MFM UA CORD DOPPLER
2 series · 15 of 28 positions shown · non-contrast
Comparison: none

[Series 1: us mfm ua cord doppler · 45 acquisitions, 12 frames shown (1 of 2)]
[im 1/45]
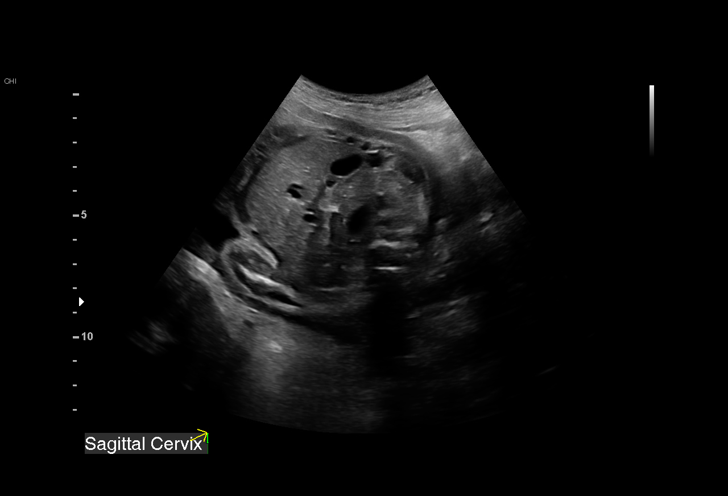
[im 5/45]
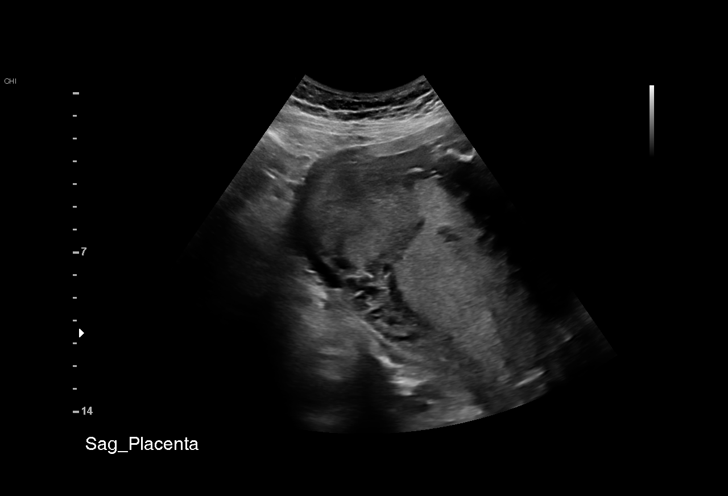
[im 9/45]
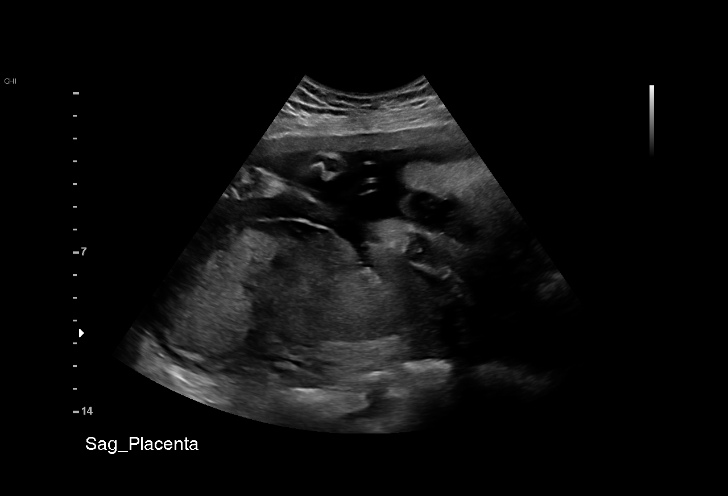
[im 13/45]
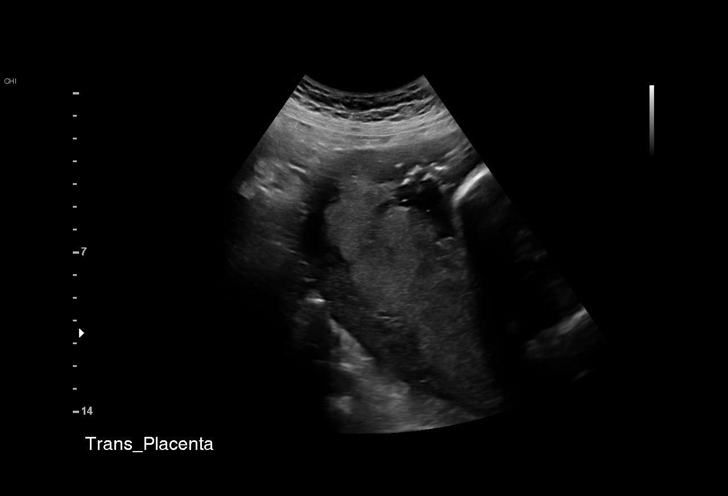
[im 17/45]
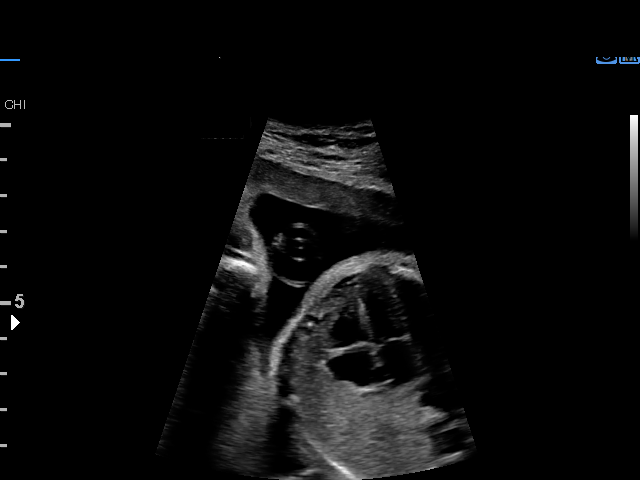
[im 21/45]
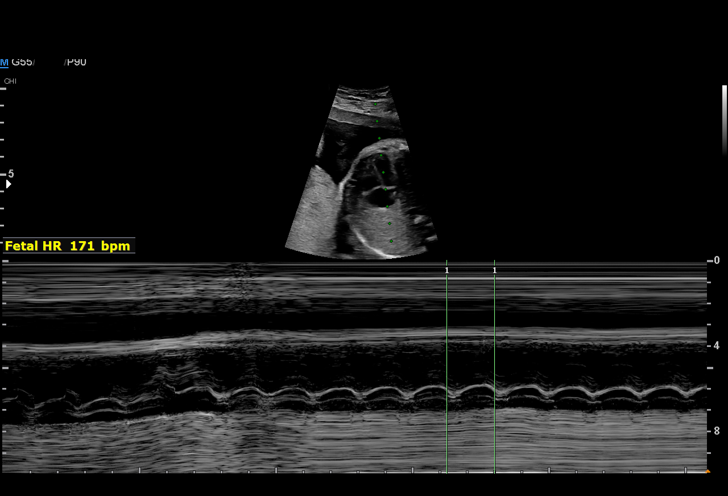
[im 26/45]
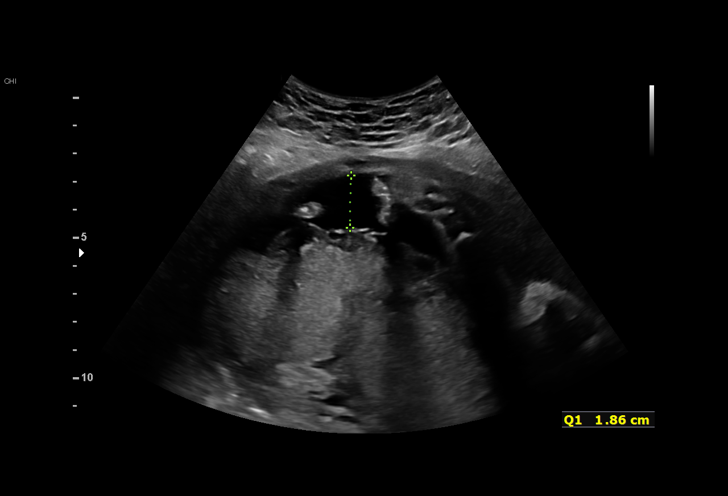
[im 30/45]
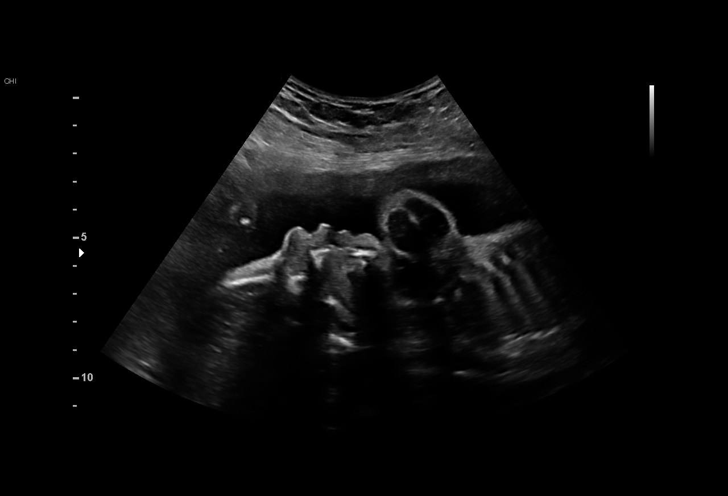
[im 32/45]
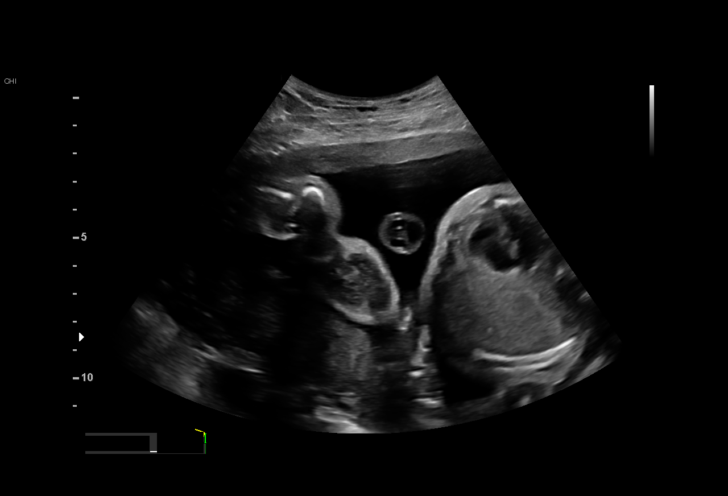
[im 36/45]
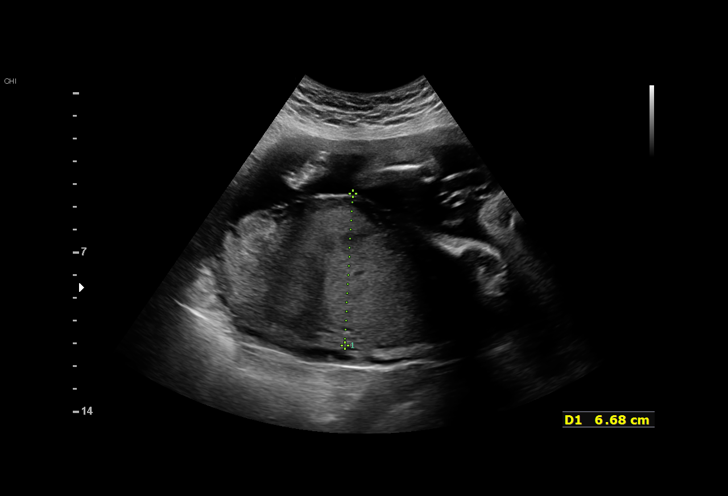
[im 40/45]
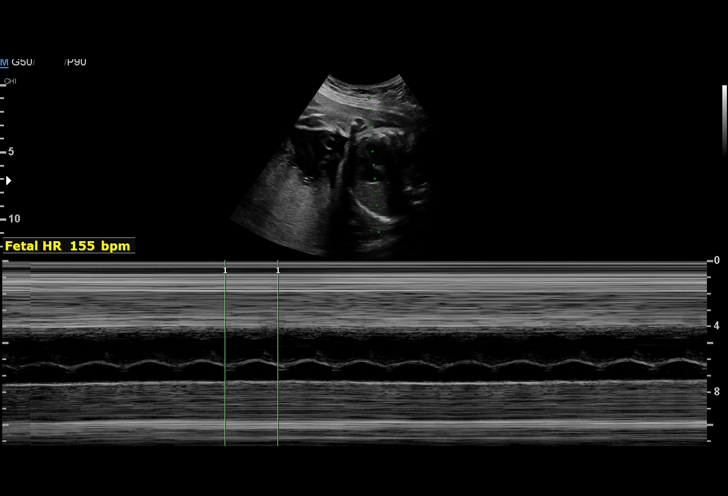
[im 45/45]
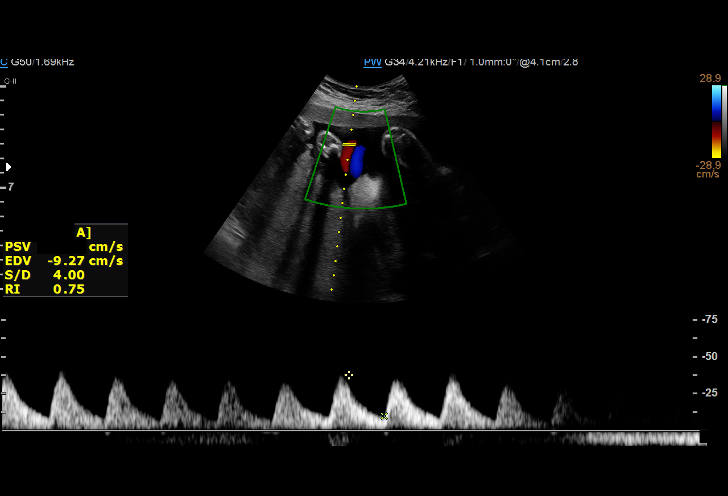

[Series 3: us mfm ua cord doppler · 3 of 11 slices shown (2 of 2)]
[im 3/11]
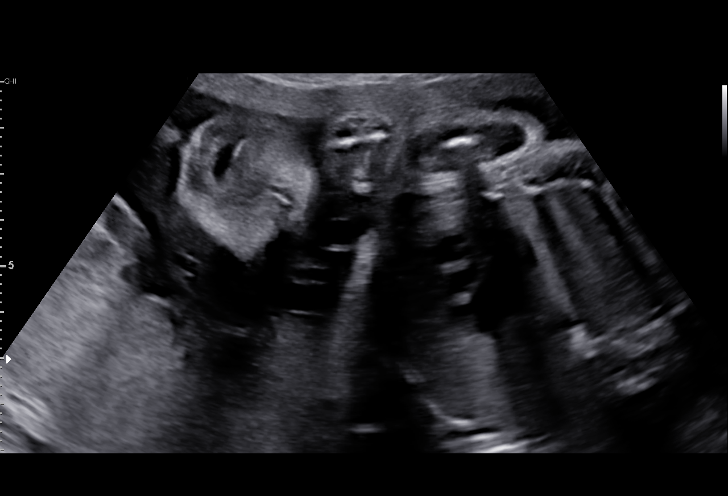
[im 7/11]
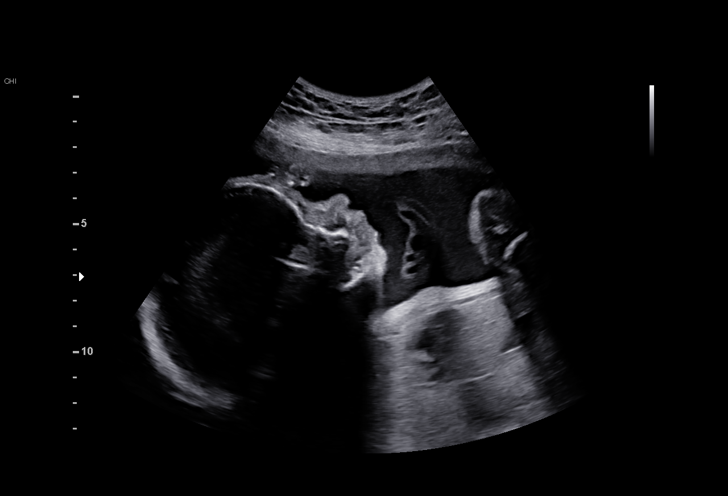
[im 11/11]
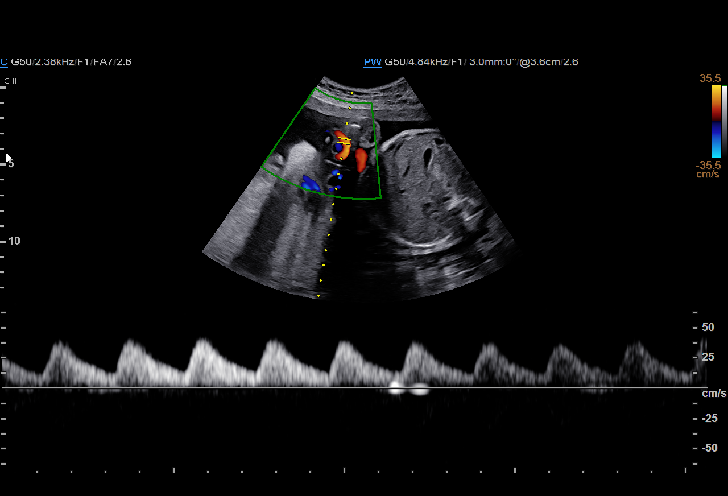

[15 of 28 positions shown; findings below may reference images not displayed]

Indications

 Maternal care for known or suspected poor
 fetal growth, third trimester, not applicable or
 unspecified IUGR
 28 weeks gestation of pregnancy
 LR NIPS, AFP - neg
Fetal Evaluation

 Num Of Fetuses:         1
 Fetal Heart Rate(bpm):  178
 Cardiac Activity:       Observed
 Presentation:           Breech
 Placenta:               Posterior Fundal
 P. Cord Insertion:      Visualized, central

 Amniotic Fluid
 AFI FV:      Within normal limits

 AFI Sum(cm)     %Tile       Largest Pocket(cm)
 10.18           13

 RUQ(cm)       RLQ(cm)       LUQ(cm)        LLQ(cm)

OB History

 Gravidity:    8         Term:   4        Prem:   1        SAB:   1
 TOP:          1        Living:  4
Gestational Age
 LMP:           28w 6d        Date:  09/06/19                 EDD:   06/12/20
 Best:          28w 6d     Det. By:  LMP  (09/06/19)          EDD:   06/12/20
Doppler - Fetal Vessels

 Umbilical Artery
  S/D     %tile      RI    %tile      PI    %tile            ADFV    RDFV
  2.68       34    0.63       39    0.96       45               No      No

Comments

 This patient was seen due to an IUGR fetus.  She reports
 persistent nausea and occasional lower abdominal pain.  She
 reports feeling vigorous fetal movements throughout the day.
 There was normal amniotic fluid noted on today's ultrasound
 exam.
 Doppler studies of the umbilical arteries performed due to
 fetal growth restriction showed a normal S/D ratio of 2.68.
 There were no signs of absent or reversed end-diastolic flow
 noted today.
 The patient subsequently had a reactive nonstress test for
 her gestational age.  No contractions were noted on the toco.
 A follow-up exam was scheduled in 1 week for an NST and
 umbilical artery Doppler studies.

 She was advised to continue to monitor the lower abdominal
 pain and to go to the hospital for evaluation should the pain
 get any worse.  She was advised to continue taking Zofran
 and Phenergan for treatment of nausea related to pregnancy.

## 2021-12-14 IMAGING — US US MFM OB FOLLOW-UP
1 series · 13 of 28 positions shown · non-contrast
Comparison: none

[Series 1: us mfm ob follow-up · 13 of 57 slices shown]
[im 3/57]
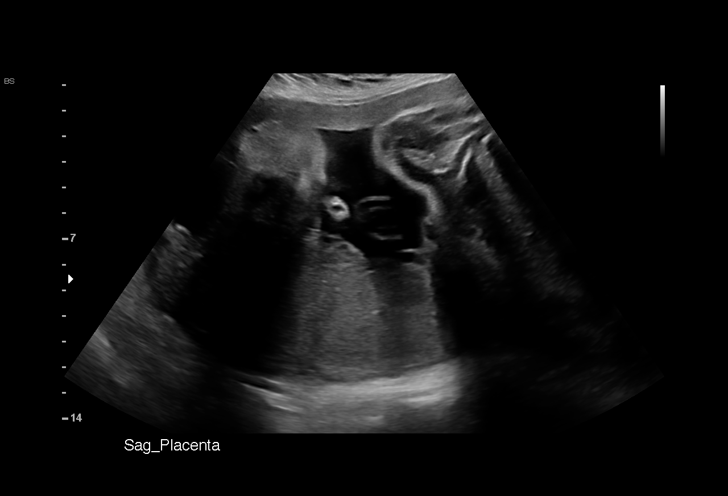
[im 7/57]
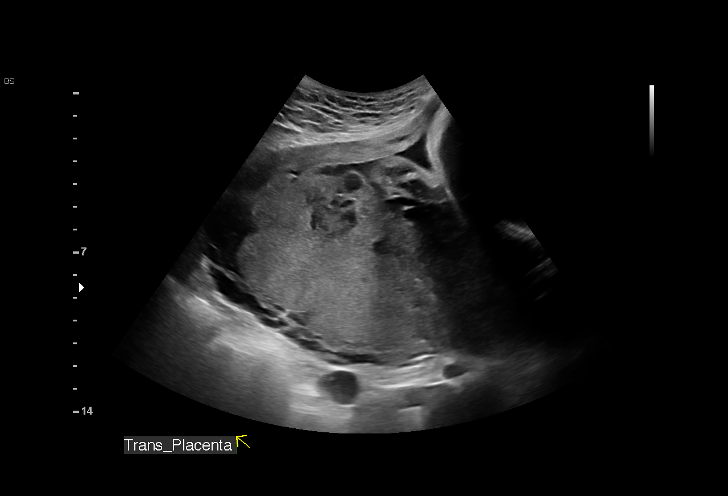
[im 11/57]
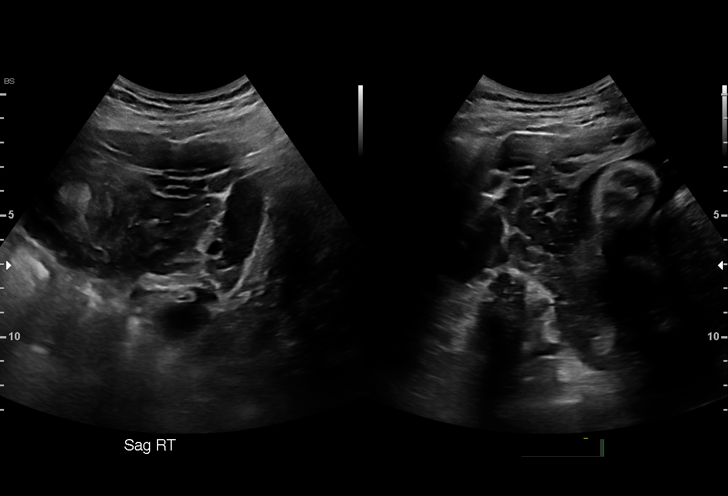
[im 15/57]
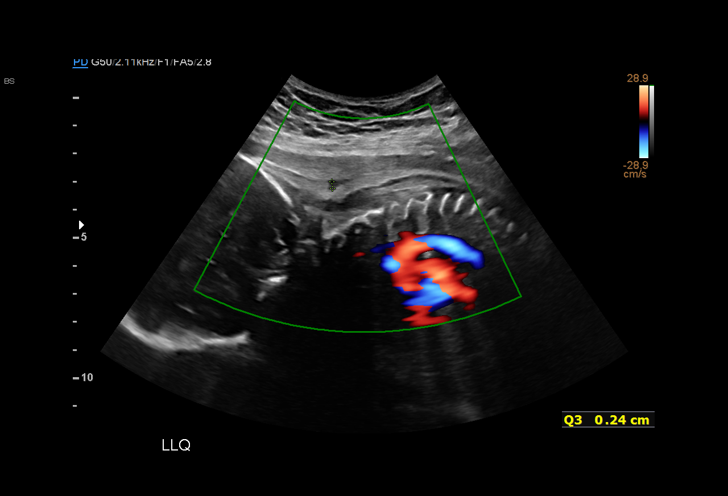
[im 19/57]
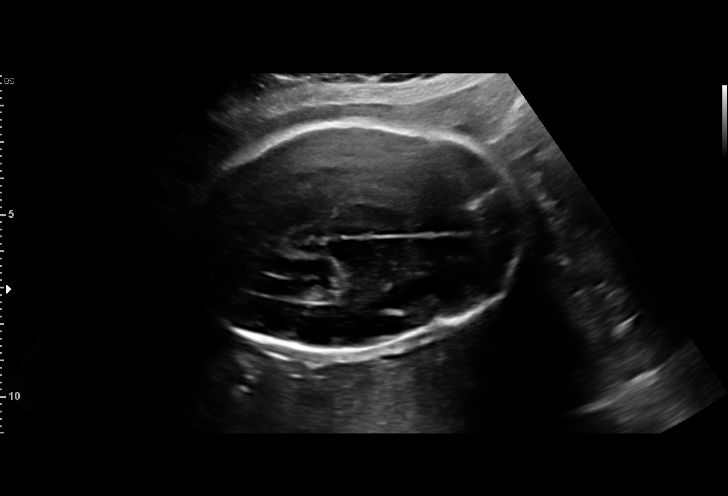
[im 23/57]
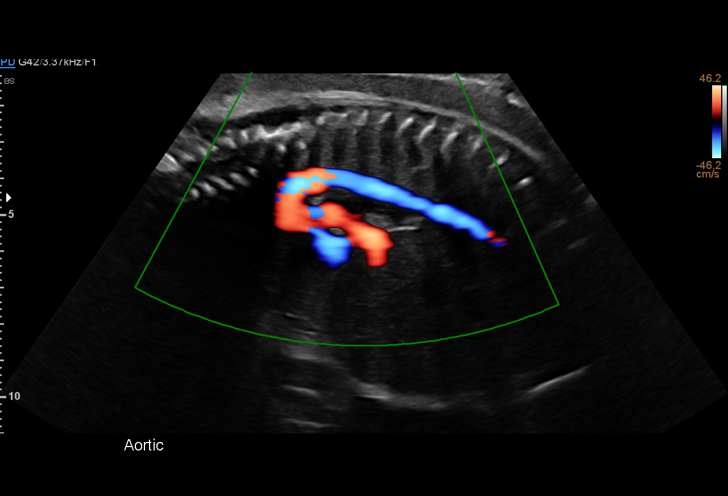
[im 30/57]
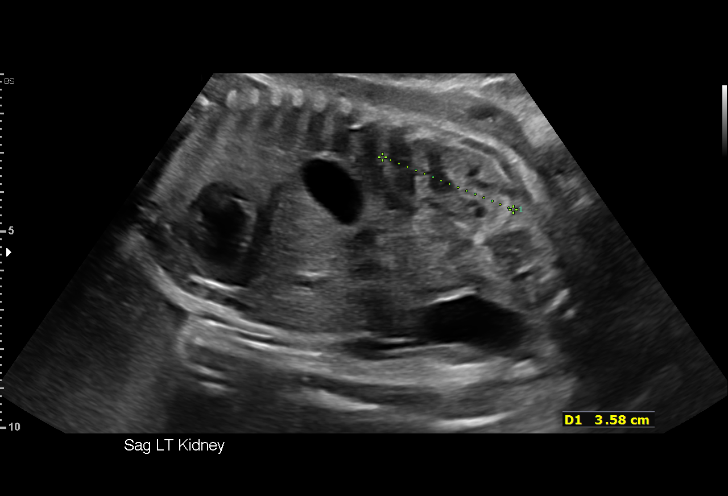
[im 34/57]
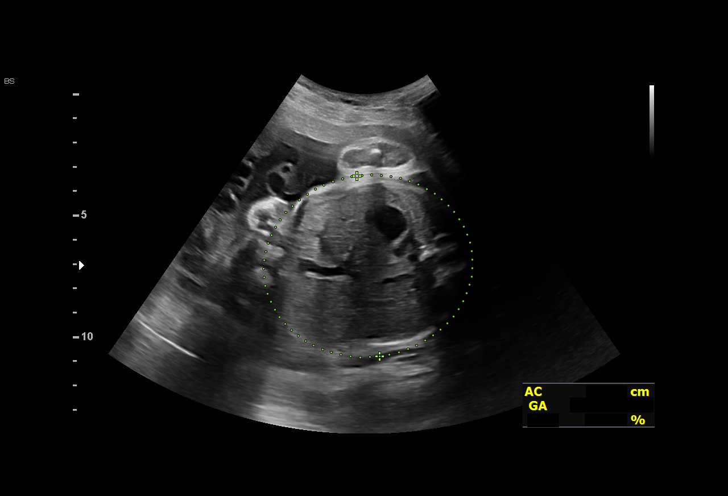
[im 38/57]
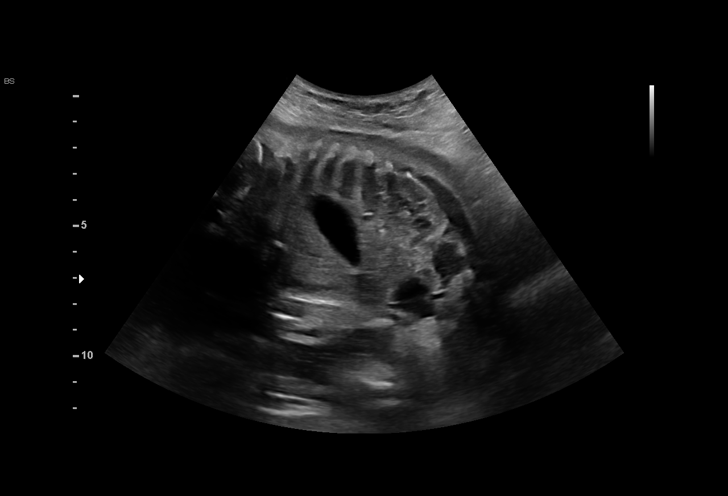
[im 42/57]
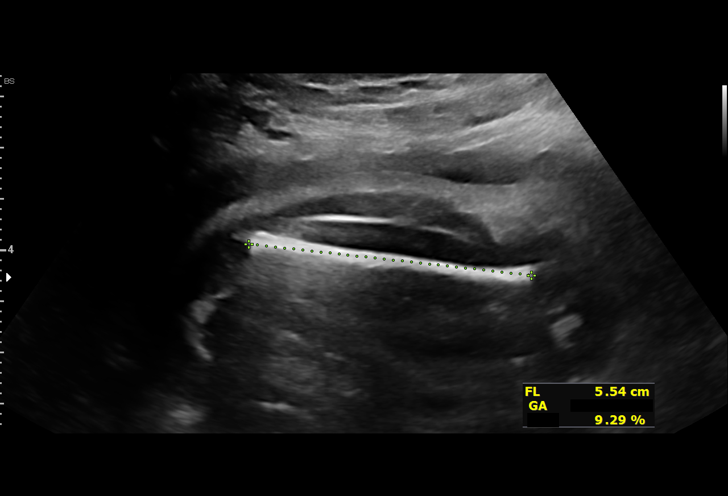
[im 46/57]
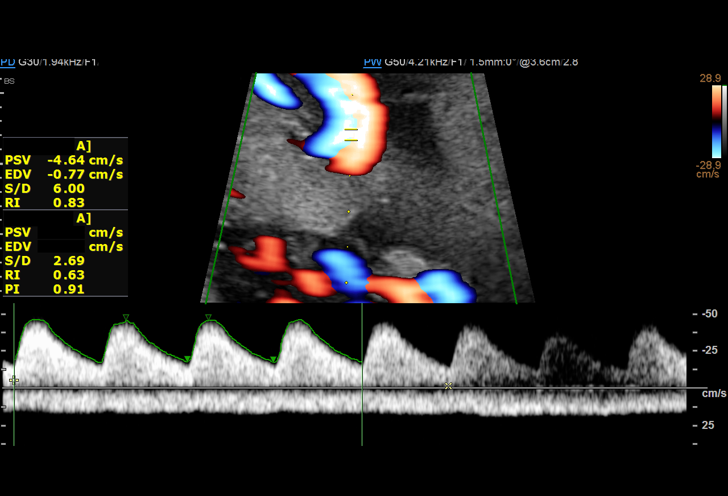
[im 50/57]
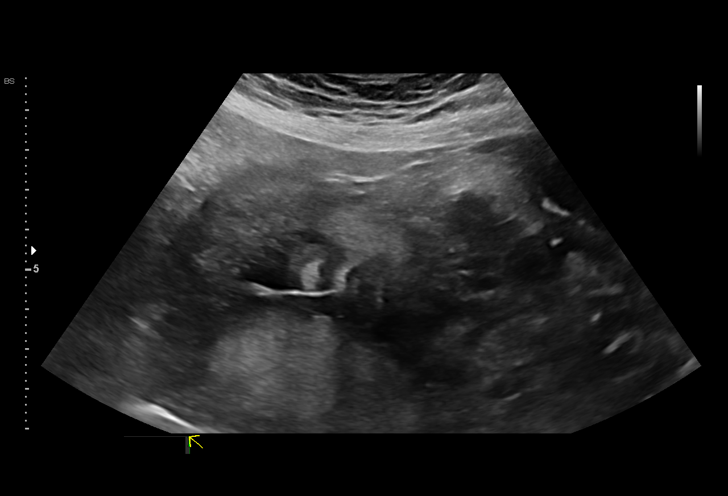
[im 54/57]
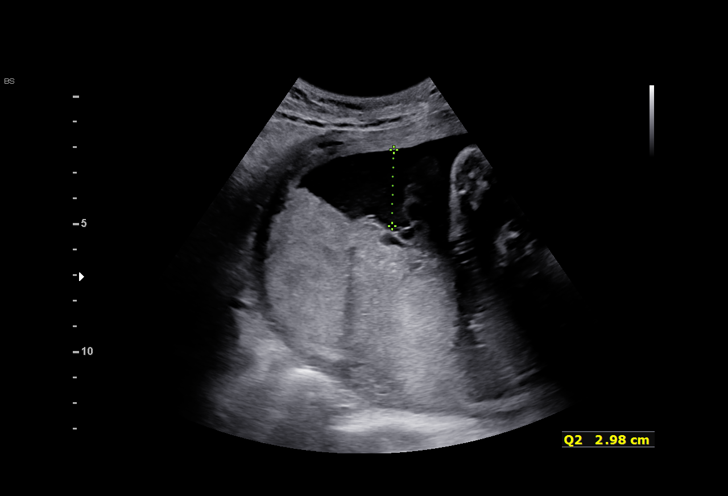

[13 of 28 positions shown; findings below may reference images not displayed]

Indications

 Maternal care for known or suspected poor
 fetal growth, third trimester, not applicable or
 unspecified IUGR
 30 weeks gestation of pregnancy
 LR NIPS, AFP - neg
 Prior poor obstetrical history antepartum,
 third trimester (preterm delivery 35 wks)
 Poor obstetric history: Previous gestational
 diabetes
Fetal Evaluation

 Num Of Fetuses:         1
 Fetal Heart Rate(bpm):  140
 Cardiac Activity:       Observed
 Presentation:           Breech
 Placenta:               Posterior Fundal
 P. Cord Insertion:      Visualized, central

 Amniotic Fluid
 AFI FV:      Within normal limits

 AFI Sum(cm)     %Tile       Largest Pocket(cm)
 11.1            23

 RUQ(cm)       RLQ(cm)       LUQ(cm)        LLQ(cm)
 2.2           4.1           3
Biometry
 BPD:      62.2  mm     G. Age:  25w 2d        < 1  %    CI:        62.66   %    70 - 86
                                                         FL/HC:      21.7   %    19.2 -
 HC:      253.5  mm     G. Age:  27w 4d        < 1  %    HC/AC:      0.98        0.99 -
 AC:      258.2  mm     G. Age:  30w 0d         32  %    FL/BPD:     88.6   %    71 - 87
 FL:       55.1  mm     G. Age:  29w 0d          8  %    FL/AC:      21.3   %    20 - 24

 LV:          5  mm

 Est. FW:    5775  gm    2 lb 15 oz       7  %
OB History

 Gravidity:    8         Term:   4        Prem:   1        SAB:   1
 TOP:          1        Living:  4
Gestational Age

 LMP:           30w 3d        Date:  09/06/19                 EDD:   06/12/20
 U/S Today:     28w 0d                                        EDD:   06/29/20
 Best:          30w 3d     Det. By:  LMP  (09/06/19)          EDD:   06/12/20
Anatomy

 Cranium:               Dolichocephaly         LVOT:                   Previously seen
 Cavum:                 Appears normal         Aortic Arch:            Appears normal
 Ventricles:            Appears normal         Ductal Arch:            Previously seen
 Choroid Plexus:        Appears normal         Diaphragm:              Appears normal
 Cerebellum:            Appears normal         Stomach:                Appears normal, left
                                                                       sided
 Posterior Fossa:       Appears normal         Abdomen:                Appears normal
 Nuchal Fold:           Not applicable (>20    Abdominal Wall:         Appears nml (cord
                        wks GA)                                        insert, abd wall)
 Face:                  Orbits and profile     Cord Vessels:           Previously seen
                        previously seen
 Lips:                  Appears normal         Kidneys:                Appear normal
 Palate:                Previously seen        Bladder:                Appears normal
 Thoracic:              Previously seen        Spine:                  Previously seen
 Heart:                 Previously seen        Upper Extremities:      Previously seen
 RVOT:                  Previously seen        Lower Extremities:      Previously seen

 Other:  Fetus appears to be female. Nasal bone prev visualized. Heels/feet
         and open hands/5th digits prev visualized. VC, 3VV and 3VTV prev
         visualized. L-spine suboptimally seen due to breech position and GA
Doppler - Fetal Vessels

 Umbilical Artery
  S/D     %tile      RI    %tile                             ADFV    RDFV
  2.69       43    0.63       47                                No      No

Cervix Uterus Adnexa

 Cervix
 Not visualized (advanced GA >82wks)
 Uterus
 No abnormality visualized.

 Right Ovary
 Not visualized.

 Left Ovary
 Not visualized.

 Cul De Sac
 No free fluid seen.

 Adnexa
 No adnexal mass visualized.
Comments

 This patient was seen for a follow up growth scan due to fetal
 growth restriction noted during her prior ultrasound exams.
 She denies any problems since her last exam and reports
 feeling vigorous fetal movements throughout the day.
 On today's exam, the EFW measures at the 7th percentile for
 her gestational age indicating fetal growth restriction.  The
 fetus has grown about 1 pound over the past 3 weeks.  There
 was normal amniotic fluid noted.
 Doppler studies of the umbilical arteries showed a normal
 S/D ratio of 2.69.  There were no signs of absent or reversed
 end-diastolic flow.
 The patient had a reactive nonstress test for her gestational
 age following today's ultrasound exam.
 The patient was advised that as long as the fetus shows
 continued growth along with normal umbilical artery Doppler
 studies and normal fetal testing, the goal for delivery will be at
 around 37 weeks.
 We will continue to follow her with weekly fetal testing and
 umbilical artery Doppler studies.
 Another exam was scheduled in 1 week.

## 2022-01-11 IMAGING — US US MFM UA CORD DOPPLER
1 series · 12 of 28 positions shown · non-contrast
Comparison: none

[Series 1: us mfm ua cord doppler · 33 acquisitions, 12 frames shown]
[im 2/33]
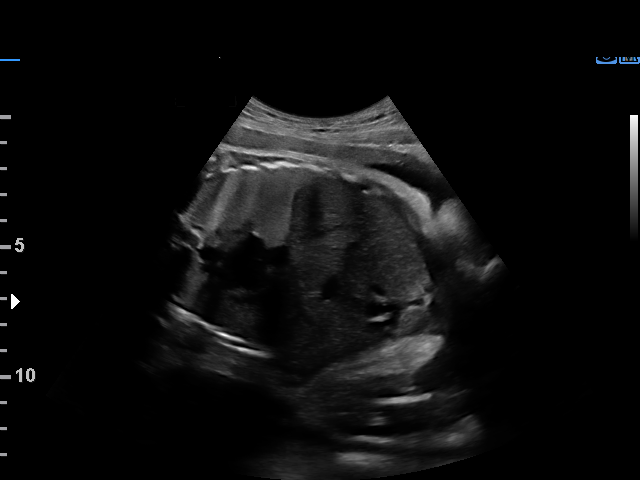
[im 4/33]
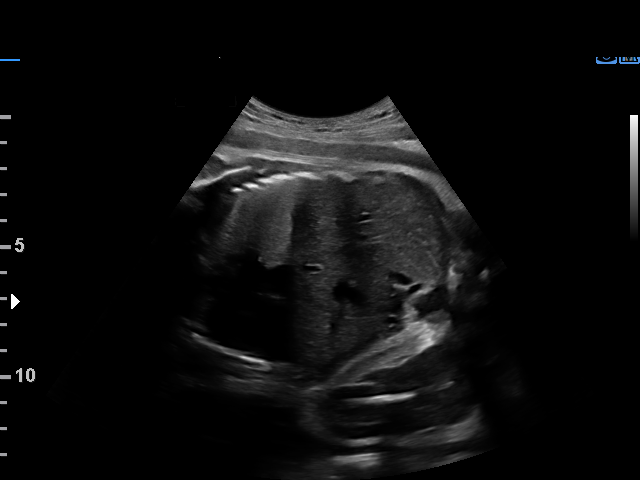
[im 6/33]
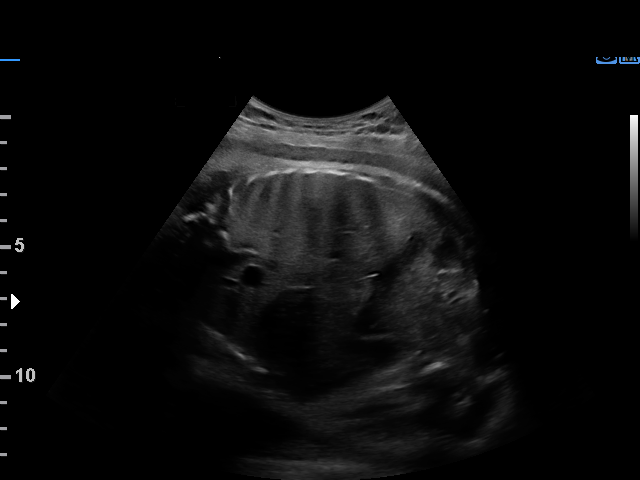
[im 10/33]
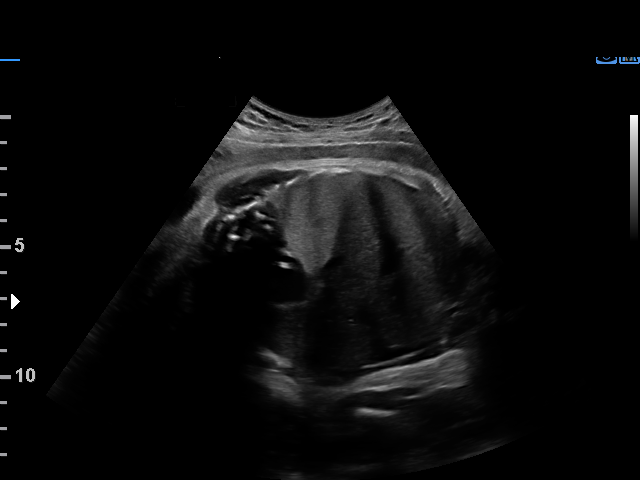
[im 12/33]
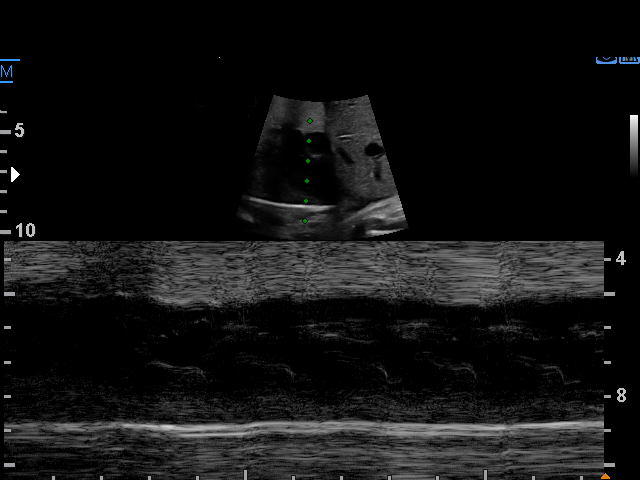
[im 15/33]
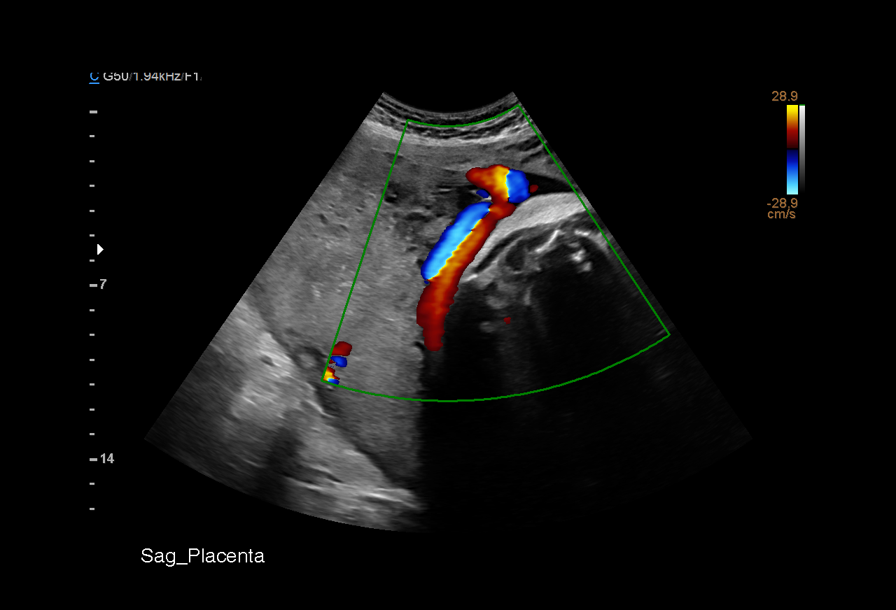
[im 18/33]
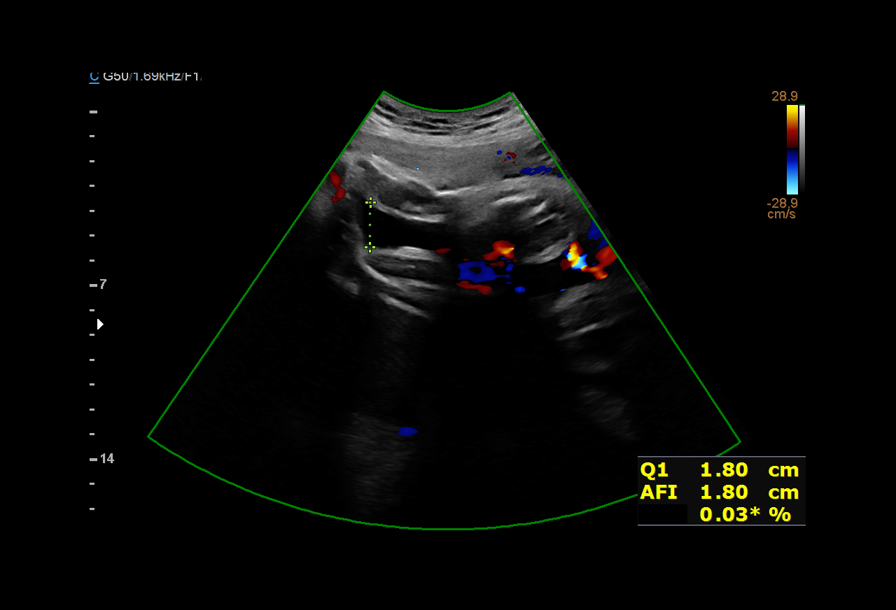
[im 21/33]
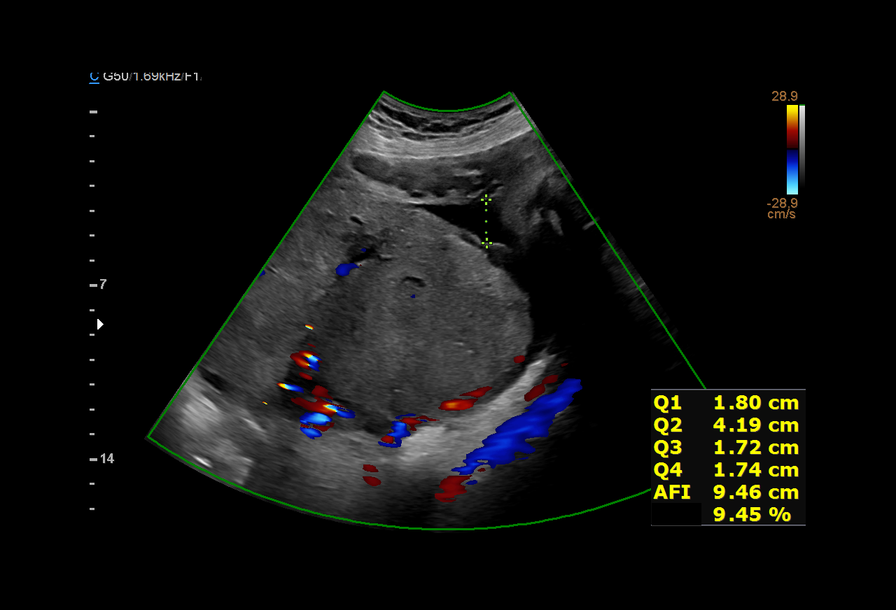
[im 23/33]
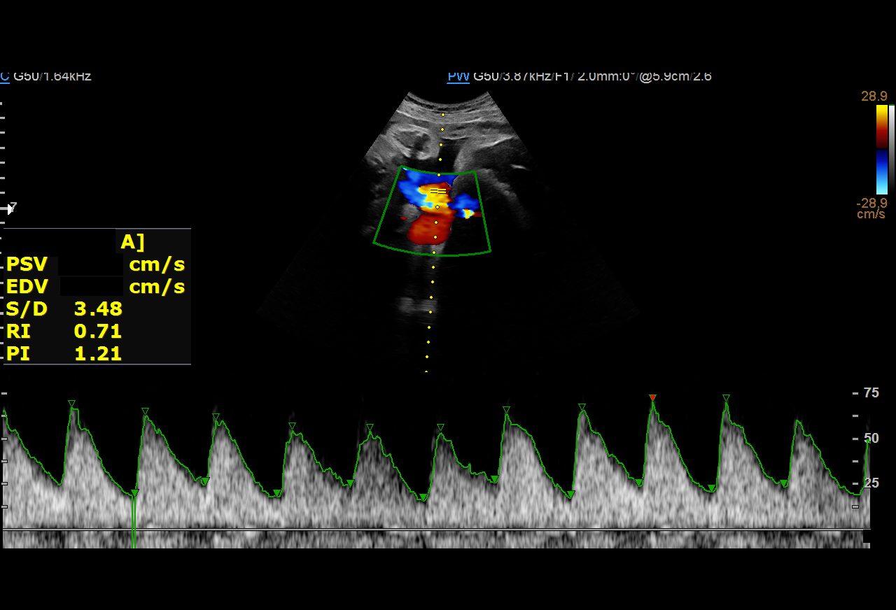
[im 27/33]
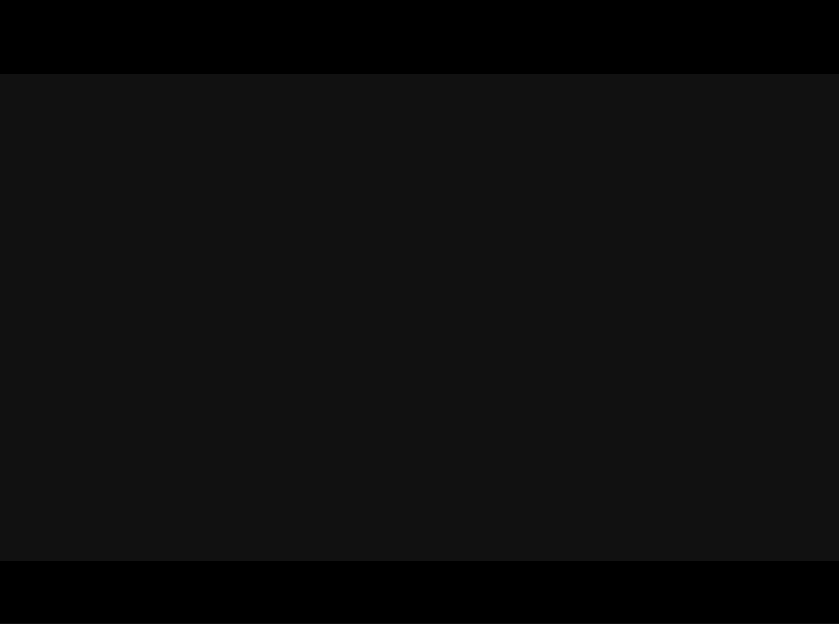
[im 29/33]
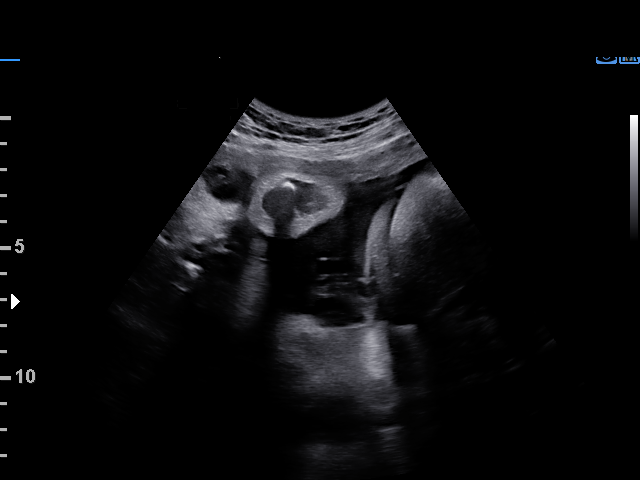
[im 31/33]
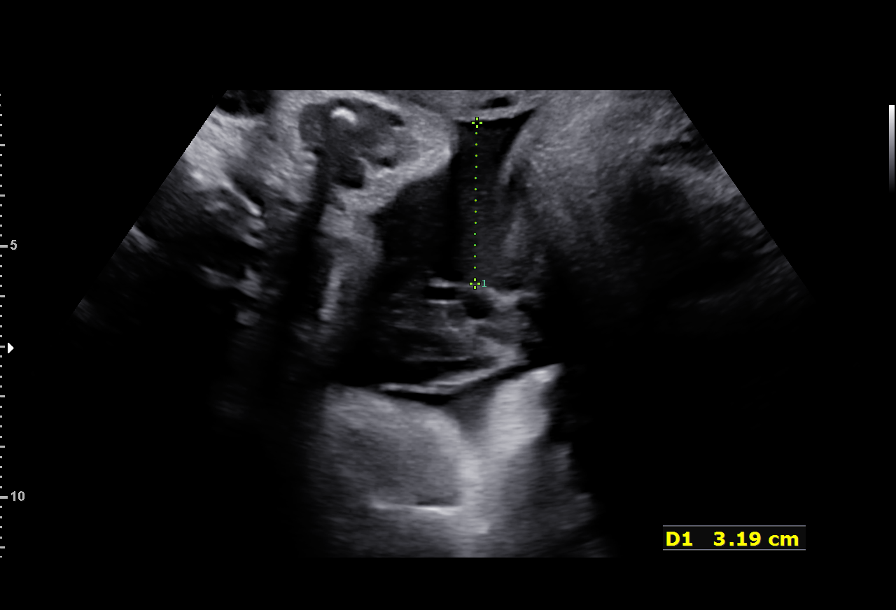

[12 of 28 positions shown; findings below may reference images not displayed]

Indications

 Maternal care for known or suspected poor
 fetal growth, third trimester, not applicable or
 unspecified IUGR
 LR NIPS, AFP - neg
 Prior poor obstetrical history antepartum,
 third trimester (preterm delivery 35 wks)
 Poor obstetric history: Previous gestational
 diabetes
 33 weeks gestation of pregnancy
Fetal Evaluation

 Num Of Fetuses:         1
 Fetal Heart Rate(bpm):  147
 Cardiac Activity:       Observed
 Presentation:           Breech
 Placenta:               Posterior Fundal
 P. Cord Insertion:      Previously Visualized

 Amniotic Fluid
 AFI FV:      Subjectively decreased

 AFI Sum(cm)     %Tile       Largest Pocket(cm)
 9.45            14

 RUQ(cm)       RLQ(cm)       LUQ(cm)        LLQ(cm)

Biophysical Evaluation
 Amniotic F.V:   Pocket => 2 cm             F. Tone:        Observed
 F. Movement:    Observed                   Score:          [DATE]
 F. Breathing:   Observed
OB History

 Gravidity:    8         Term:   4        Prem:   1        SAB:   1
 TOP:          1        Living:  4
Gestational Age

 LMP:           34w 3d        Date:  09/06/19                 EDD:   06/12/20
 Best:          33w 2d     Det. By:  Previous Ultrasound      EDD:   06/20/20
                                     (10/27/19)
Doppler - Fetal Vessels

 Umbilical Artery
  S/D     %tile      RI    %tile                             ADFV    RDFV
  2.63       53    0.62       60                                No      No

Comments

 This patient was seen due to a borderline IUGR fetus.  She
 denies any problems since her last exam.  She reports
 feeling vigorous fetal movements throughout the day.
 A biophysical profile performed today was [DATE].
 There was normal amniotic fluid noted on today's ultrasound
 exam.
 Doppler studies of the umbilical arteries showed a normal
 S/D ratio of 2.63.  There were no signs of absent or reversed
 end-diastolic flow noted today.
 As the patient stated that she lives over an hour away, she
 would like to skip next week's appointment.  As the EFW
 measured at the 18th percentile last week and today's fetal
 testing was reassuring, I advised her to return for her next
 ultrasound on [REDACTED] or [REDACTED] [DATE] or 15.

 She was advised to continue monitoring fetal movements on
 a daily basis.
